# Patient Record
Sex: Female | Born: 1960
Health system: Southern US, Community
[De-identification: ages and names within clinical notes are randomized; demographics above are authoritative.]

---

## 2020-06-09 IMAGING — MR MR KNEE*L* W/O CM
4 of 6 series · 22 of 40 positions shown · non-contrast
Comparison: Left knee x-rays dated [DATE].

CLINICAL DATA: Left knee pain and swelling for the past 2 months.
No injury or prior surgery.

EXAM:
MRI OF THE LEFT KNEE WITHOUT CONTRAST
TECHNIQUE: Multiplanar, multisequence MR imaging of the knee was performed. No
intravenous contrast was administered.

[Series 3: T2 fat-sat · axial · 4.0mm · 0.50mm/px · z∈[-103,+26]mm · 5 of 27 slices shown (1 of 2)]
[im 1/27]
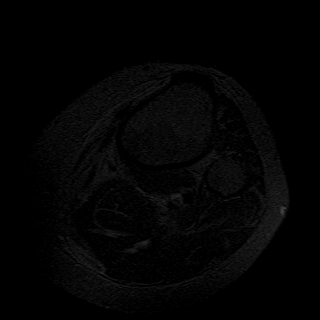
[im 7/27]
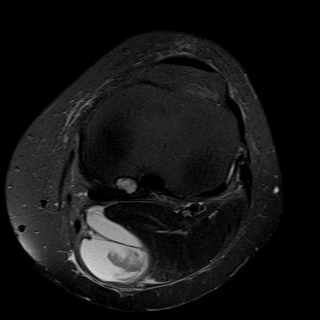
[im 14/27]
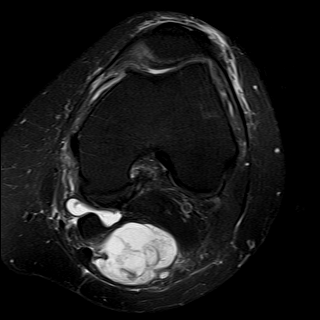
[im 20/27]
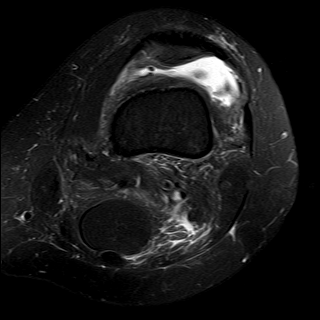
[im 27/27]
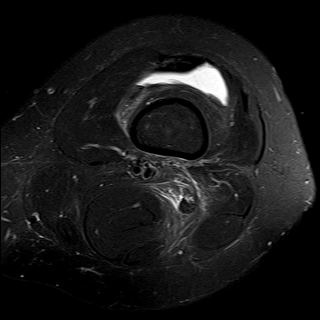

[Series 5: T2 fat-sat · coronal · 4.0mm · 0.31mm/px · 3 of 30 slices shown (2 of 2)]
[im 5/30]
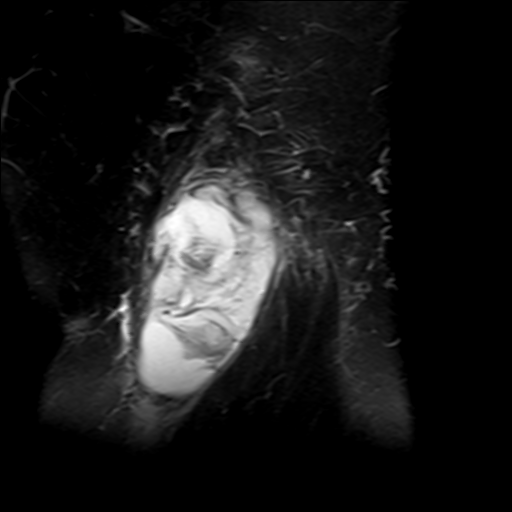
[im 15/30]
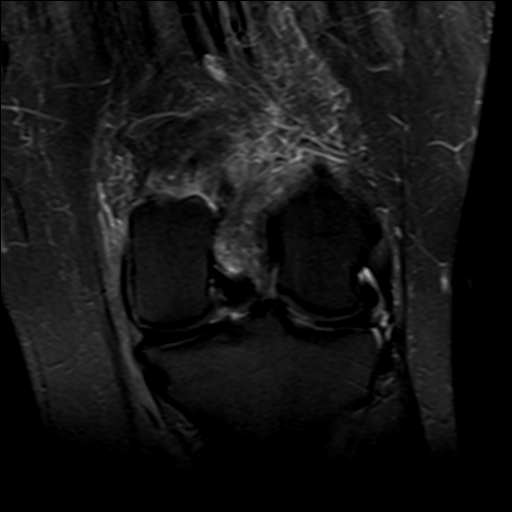
[im 25/30]
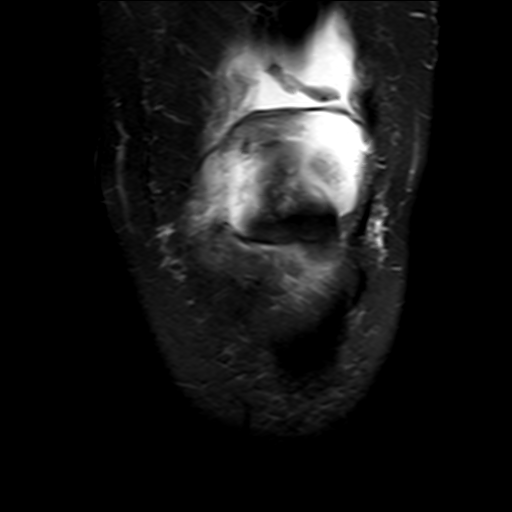

[Series 7: PD fat-sat · sagittal · 3.0mm · 0.31mm/px · 7 of 31 slices shown (1 of 2)]
[im 1/31]
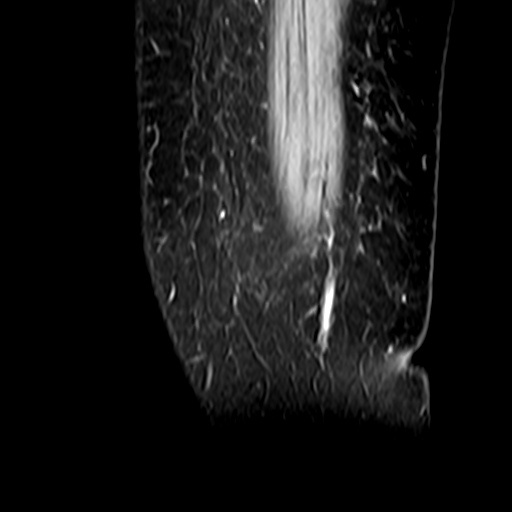
[im 6/31]
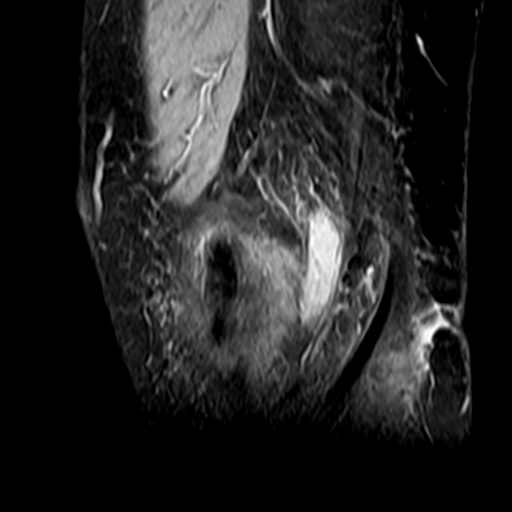
[im 11/31]
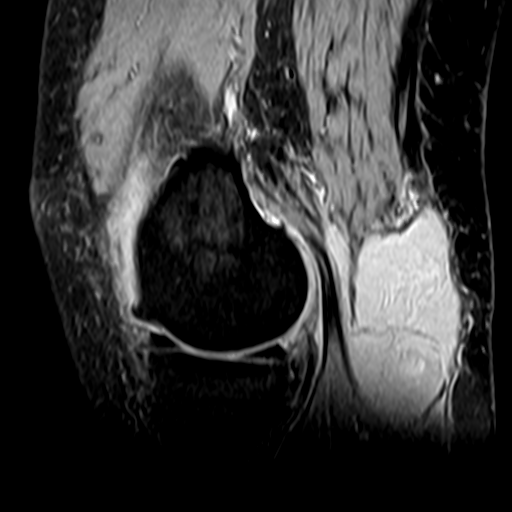
[im 16/31]
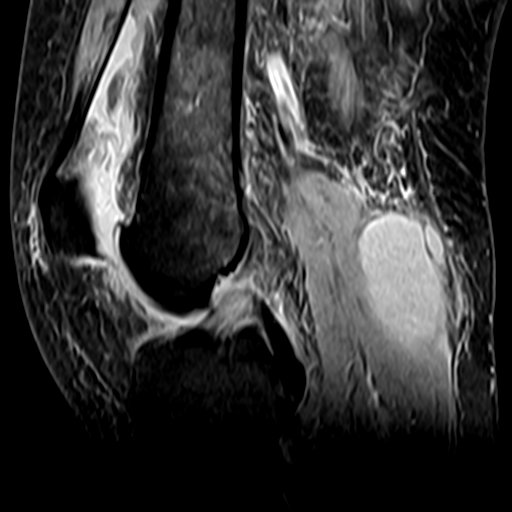
[im 21/31]
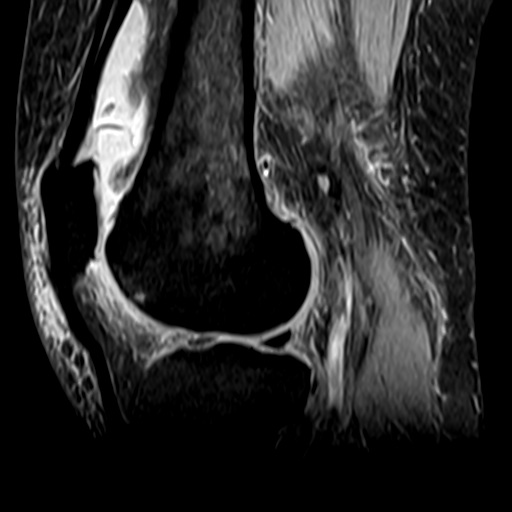
[im 26/31]
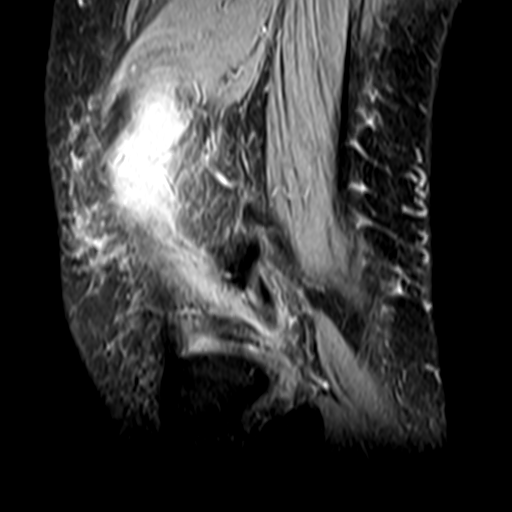
[im 31/31]
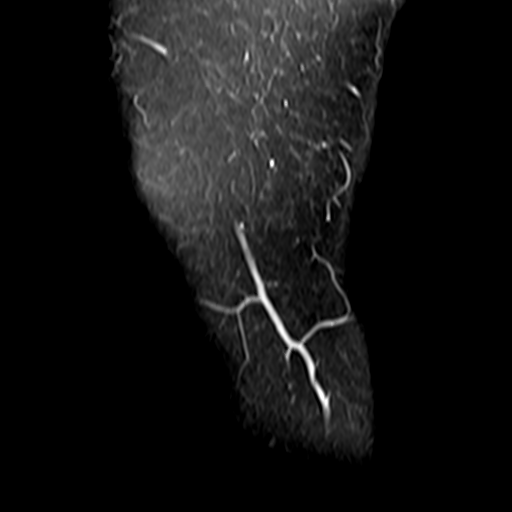

[Series 8: PD fat-sat · coronal · 4.0mm · 0.31mm/px · 7 of 30 slices shown (2 of 2)]
[im 1/30]
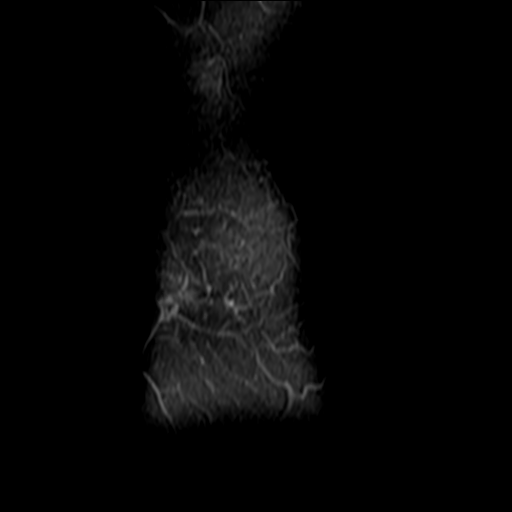
[im 5/30]
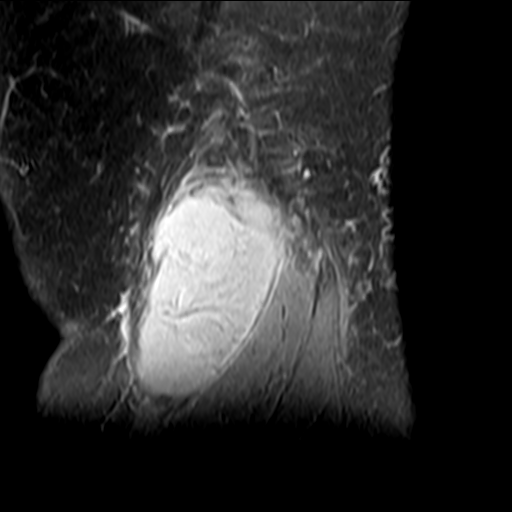
[im 10/30]
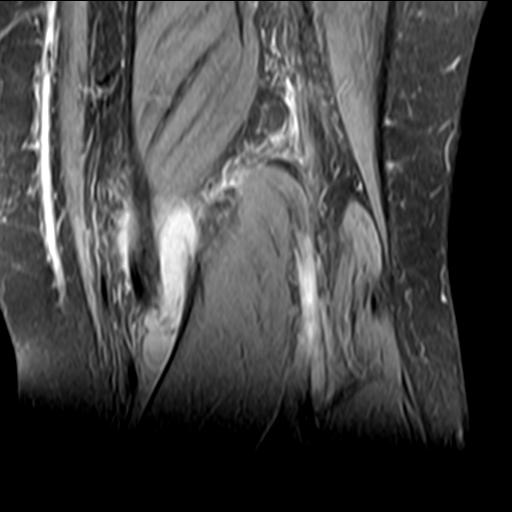
[im 15/30]
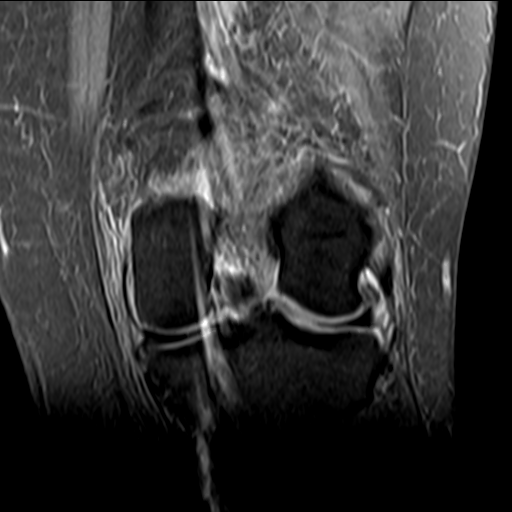
[im 20/30]
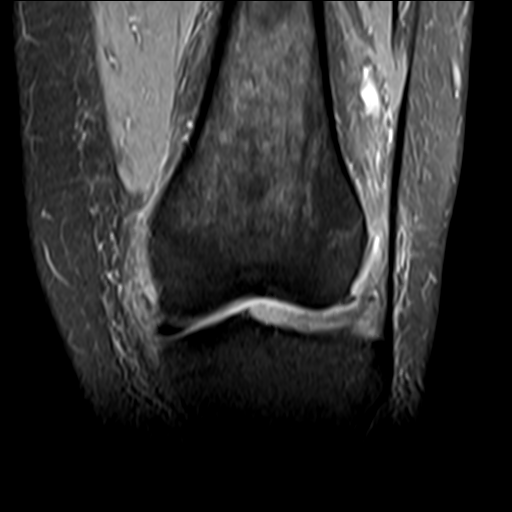
[im 25/30]
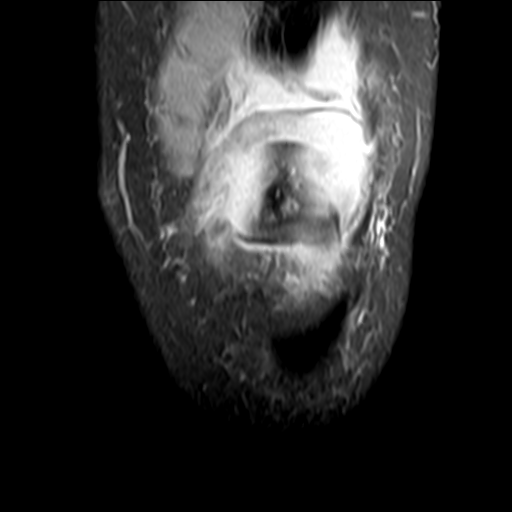
[im 30/30]
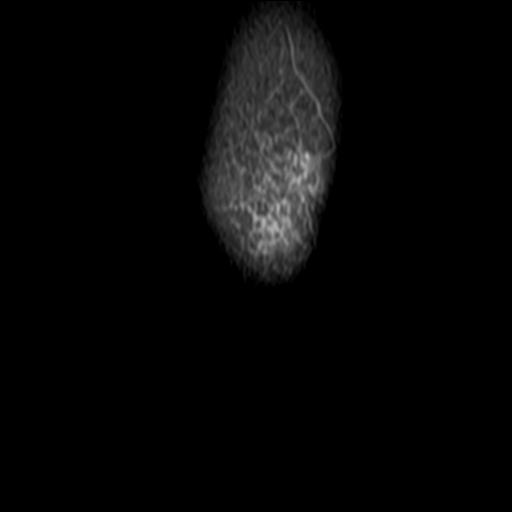

[22 of 40 positions shown; findings below may reference images not displayed]

FINDINGS: MENISCI

Medial meniscus:  Intact.

Lateral meniscus:  Intact.

LIGAMENTS

Cruciates:  Intact ACL and PCL.

Collaterals: Medial collateral ligament is intact. Lateral
collateral ligament complex is intact.

CARTILAGE

Patellofemoral: Full-thickness cartilage loss over the patellar apex
with underlying subchondral marrow edema. Partial-thickness
cartilage loss over the remaining patella and trochlea.

Medial:  Cartilage thinning centrally without focal defect.

Lateral:  Cartilage thinning centrally without focal defect.

Joint: Moderate joint effusion. Edema within the superolateral
aspect of Hoffa's fat.

Popliteal Fossa: Large complex Baker cyst. Intact popliteus tendon.

Extensor Mechanism: Intact quadriceps tendon and patellar tendon.
Intact medial and lateral patellar retinaculum. Intact MPFL.

Bones: No acute fracture or dislocation. No suspicious bone lesion.
Degenerative cyst or ganglion in the mesial aspect of the posterior
medial tibial plateau.

Other: None.
IMPRESSION: 1. No meniscal or ligamentous injury.
2. Tricompartmental osteoarthritis, moderate in the patellofemoral
compartment.
3. Edema within the superolateral aspect of Hoffa's fat pad, which
can be seen in the setting of patellar tendon-lateral femoral
condyle friction syndrome.
4. Moderate joint effusion. Large complex Baker cyst.

## 2020-06-15 IMAGING — MG DIGITAL SCREENING BILAT W/ TOMO W/ CAD
8 series · 9 of 24 positions shown · non-contrast
Comparison: None.

CLINICAL DATA: Screening.

EXAM:
DIGITAL SCREENING BILATERAL MAMMOGRAM WITH TOMO AND CAD

[R CC synth-2D]
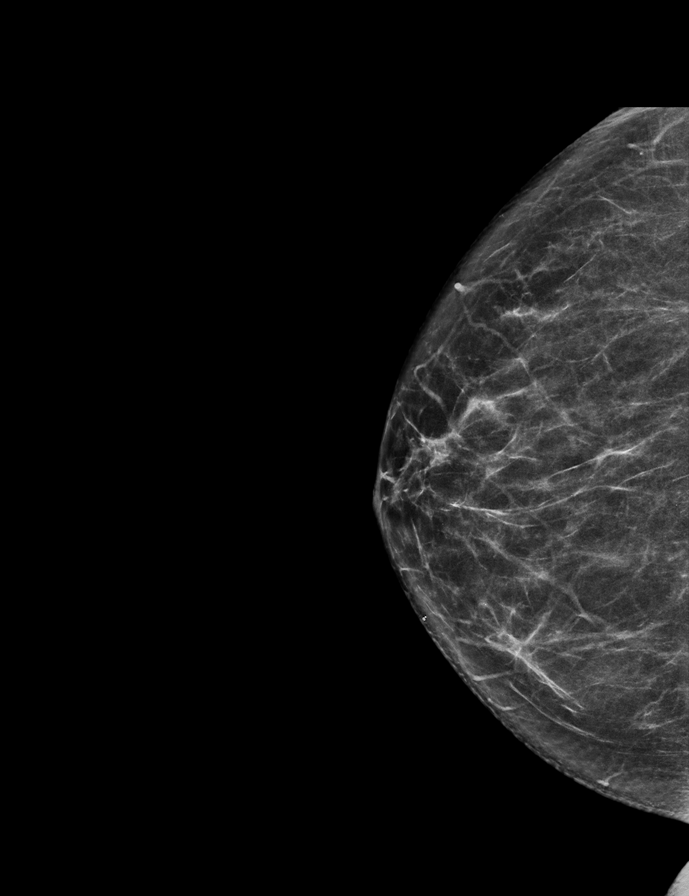

[L CC synth-2D]
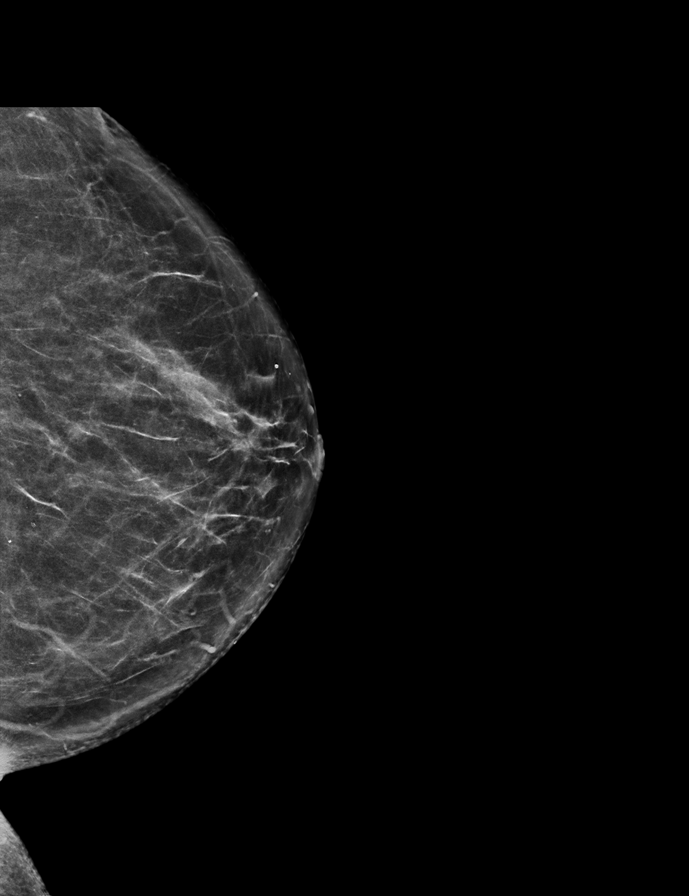

[R MLO synth-2D]
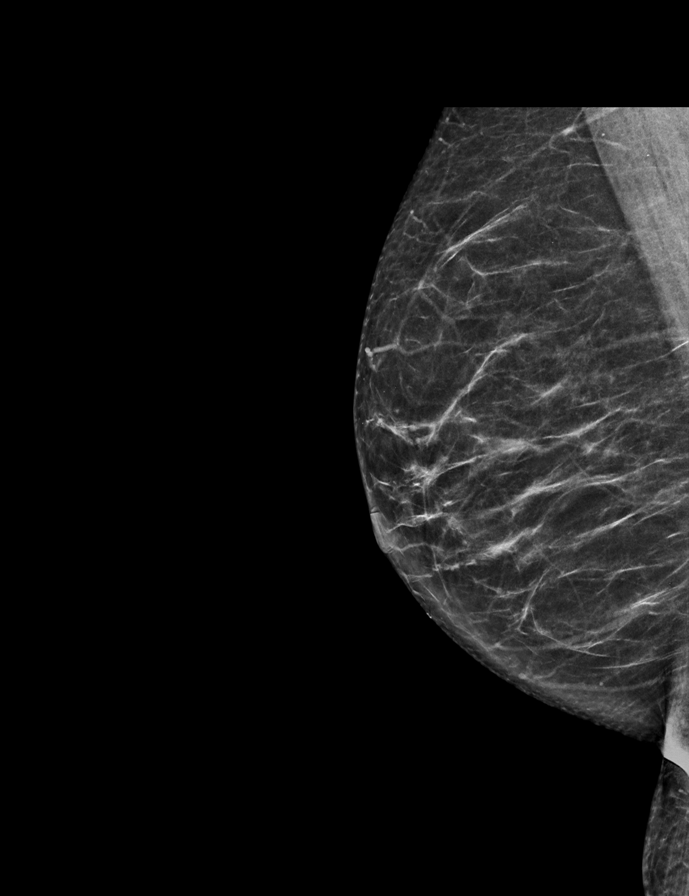

[L MLO synth-2D]
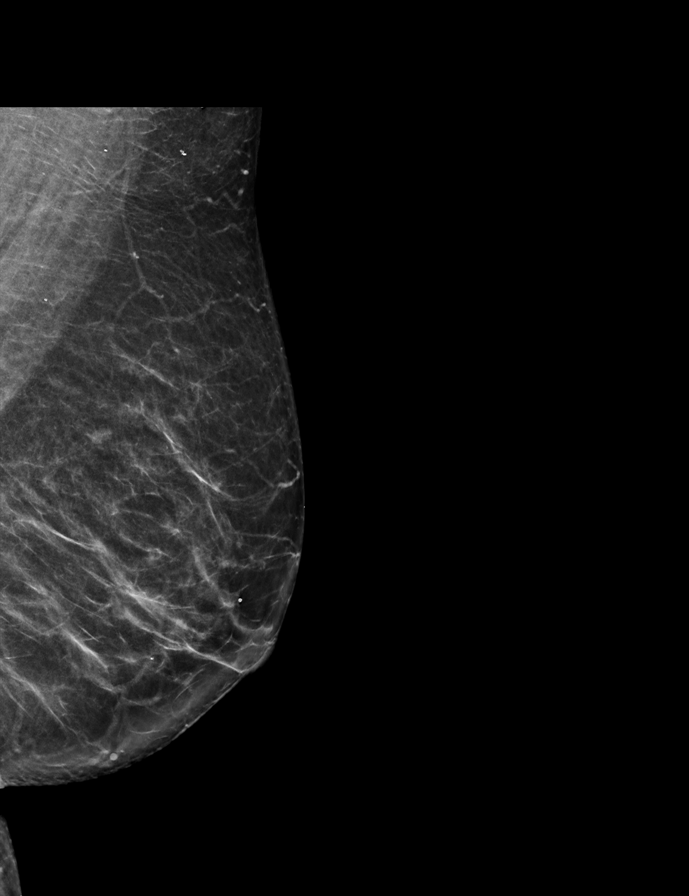

[L CC tomo · 2 of 68 frames shown]
[frame 22/68]
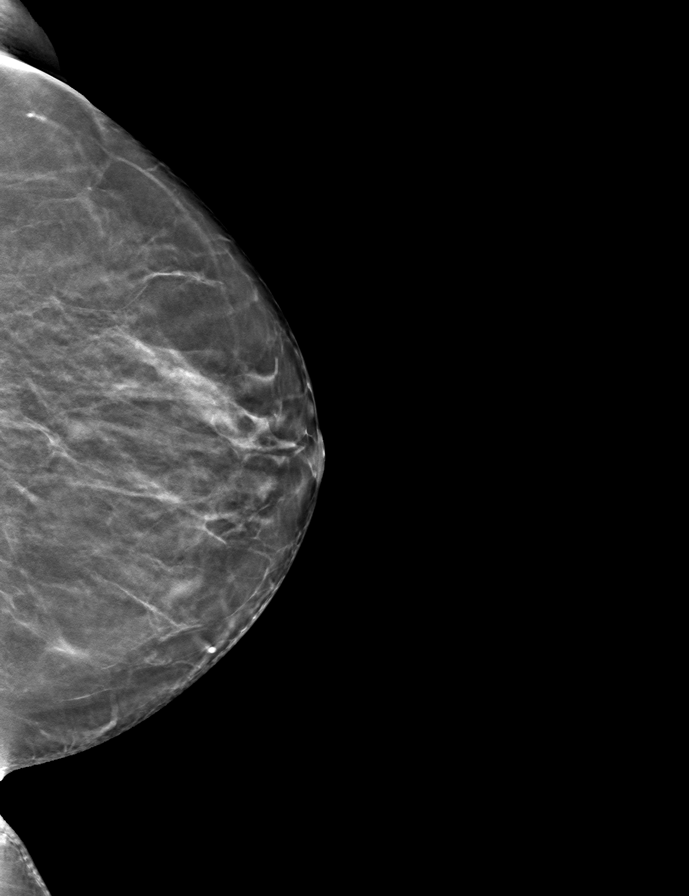
[frame 35/68]
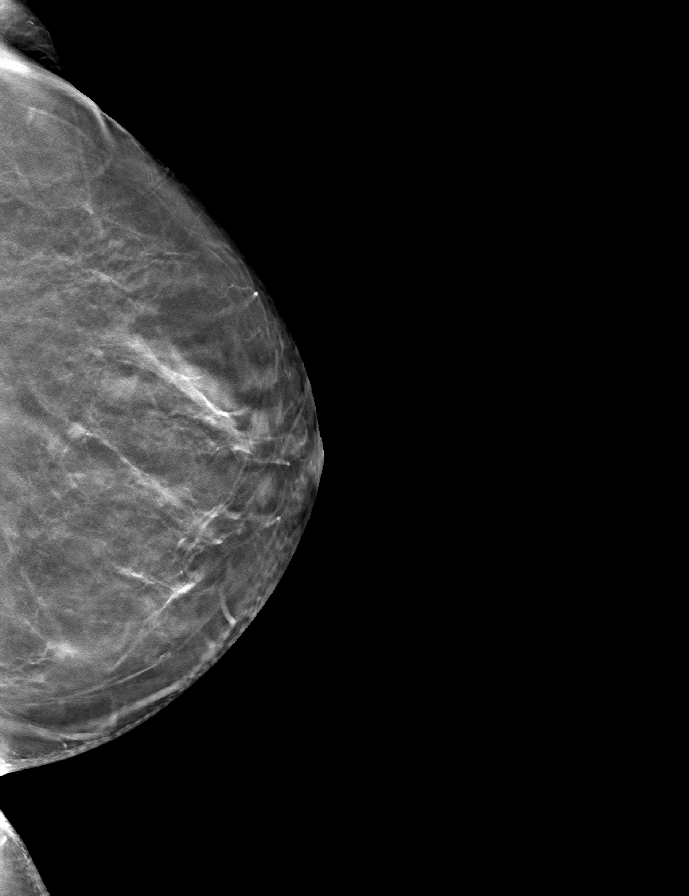

[R CC tomo · tomo slice 33/64.0]
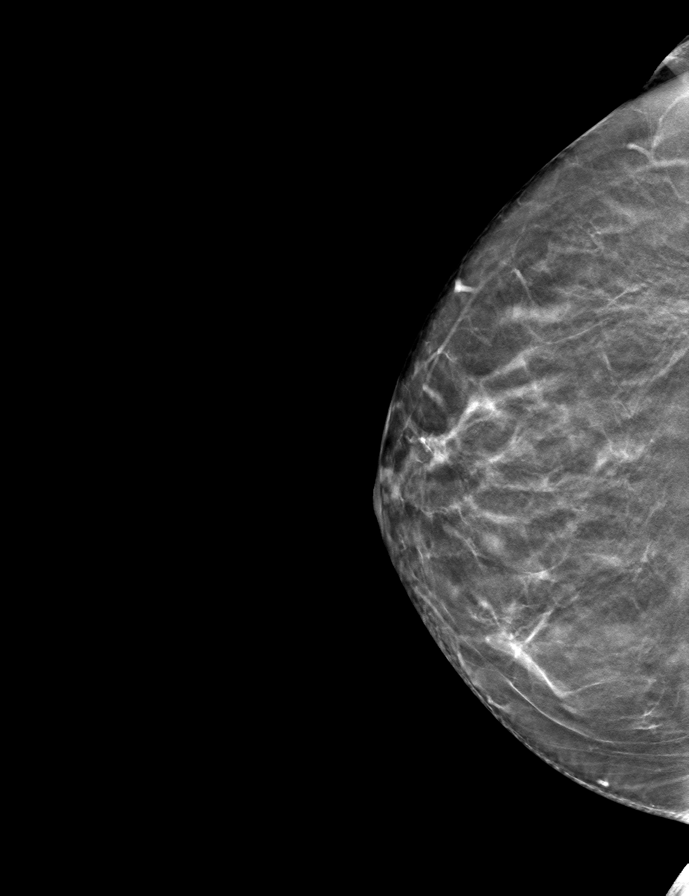

[R MLO tomo · tomo slice 33/65.0]
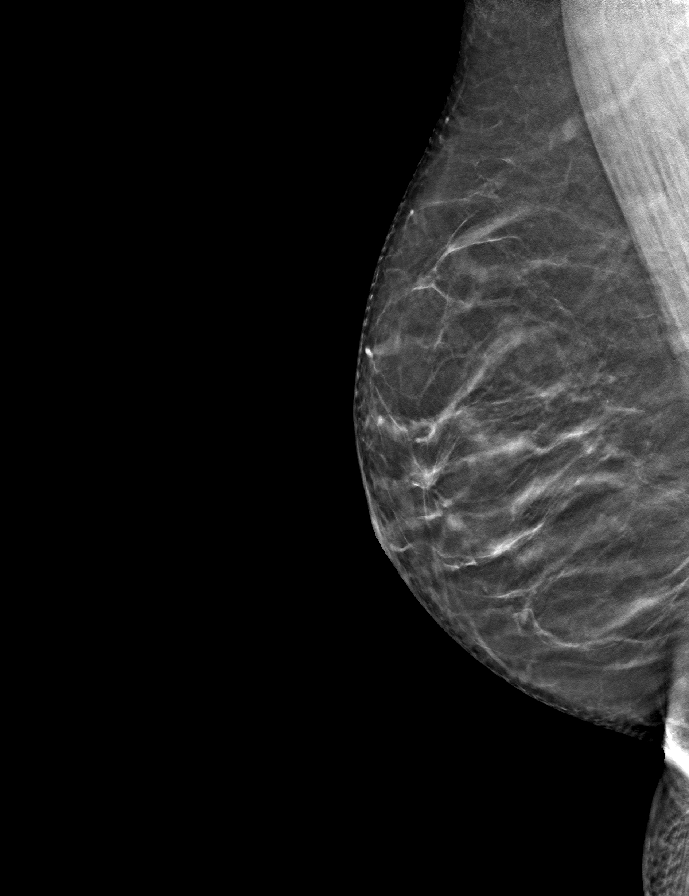

[L MLO tomo · tomo slice 33/66.0]
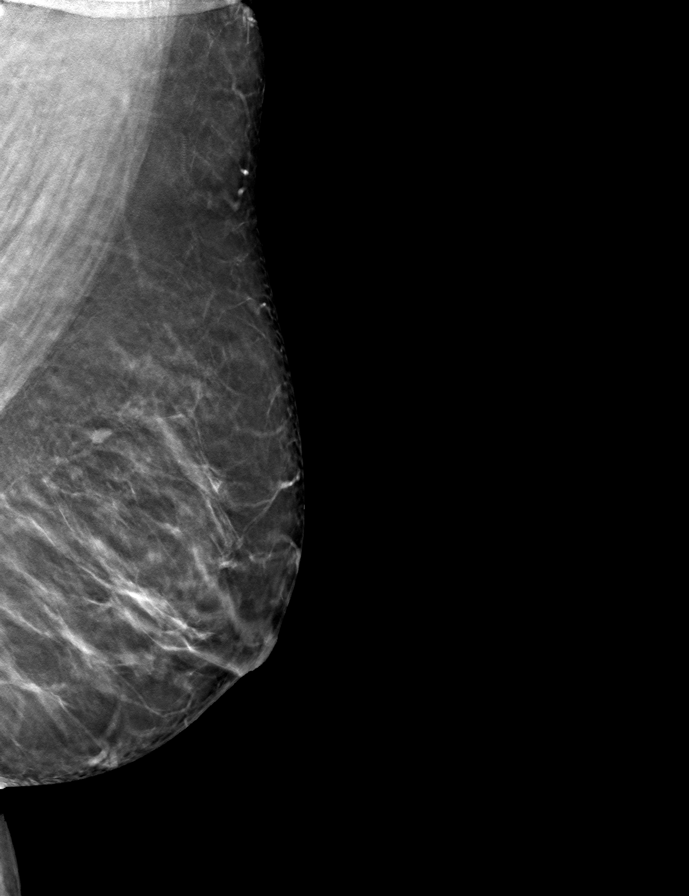

[9 of 24 positions shown; findings below may reference images not displayed]

ACR Breast Density Category b: There are scattered areas of
fibroglandular density.
FINDINGS: In the left breast, a possible mass warrants further evaluation. In
the right breast, no findings suspicious for malignancy.

Images were processed with CAD.
IMPRESSION: Further evaluation is suggested for a possible mass in the left
breast.

RECOMMENDATION:
Diagnostic mammogram and possibly ultrasound of the left breast.
(Code:[1W])

The patient will be contacted regarding the findings, and additional
imaging will be scheduled.

BI-RADS CATEGORY  0: Incomplete. Need additional imaging evaluation
and/or prior mammograms for comparison.

## 2020-07-10 IMAGING — MG MM DIGITAL DIAGNOSTIC UNILAT*L* W/ TOMO W/ CAD
4 series · 5 of 12 positions shown · non-contrast
Comparison: None.
COMPARISON: None.
COMPARISON: None.

Addendum:
CLINICAL DATA: Patient recalled from screening for left breast
mass.

EXAM:
DIGITAL DIAGNOSTIC LEFT MAMMOGRAM WITH CAD AND TOMO
ULTRASOUND LEFT BREAST

[L CC synth-2D]
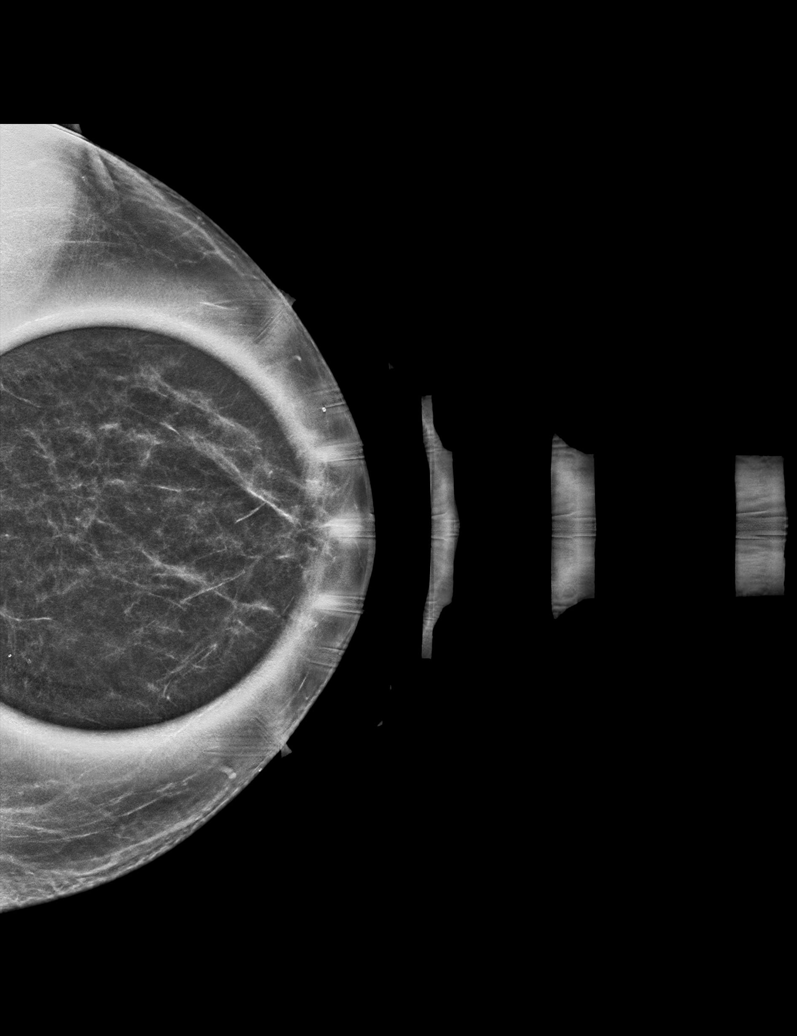

[L MLO synth-2D]
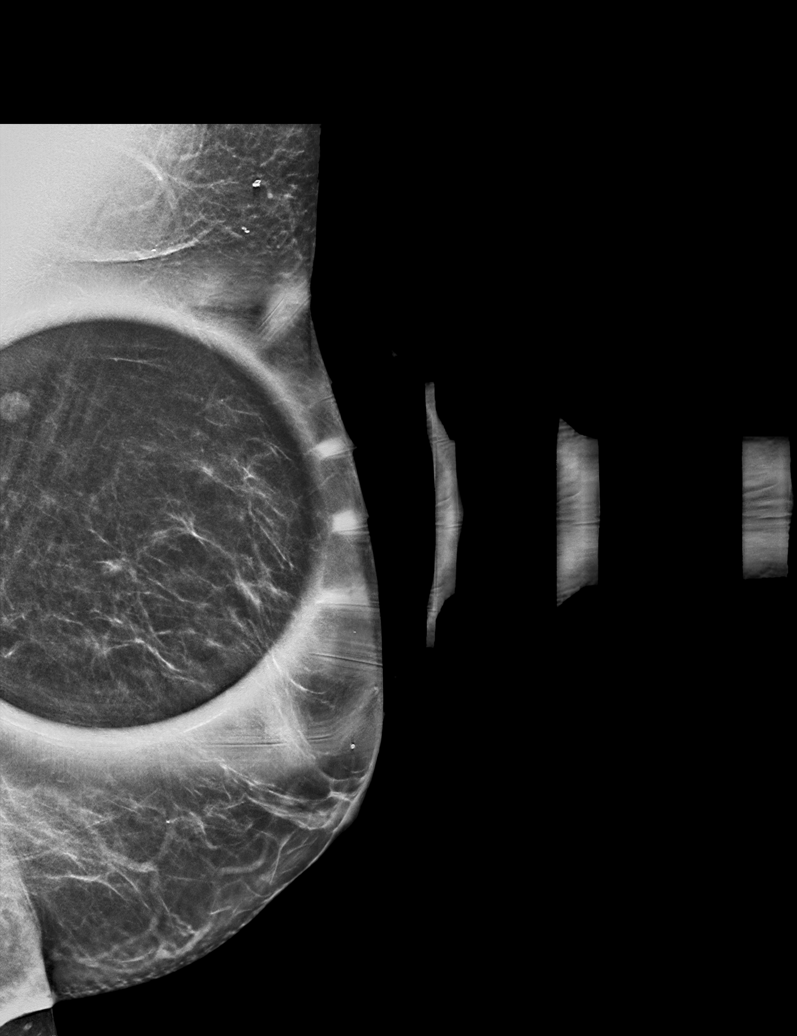

[L CC tomo · 2 of 56 frames shown]
[frame 19/56]
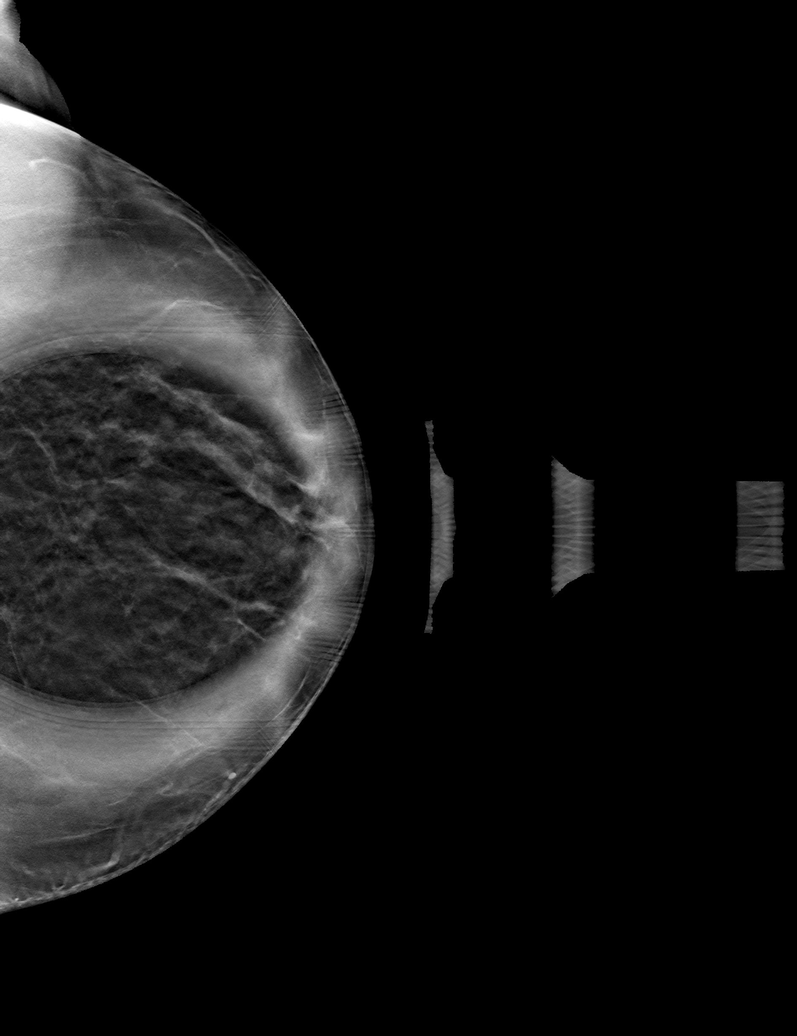
[frame 29/56]
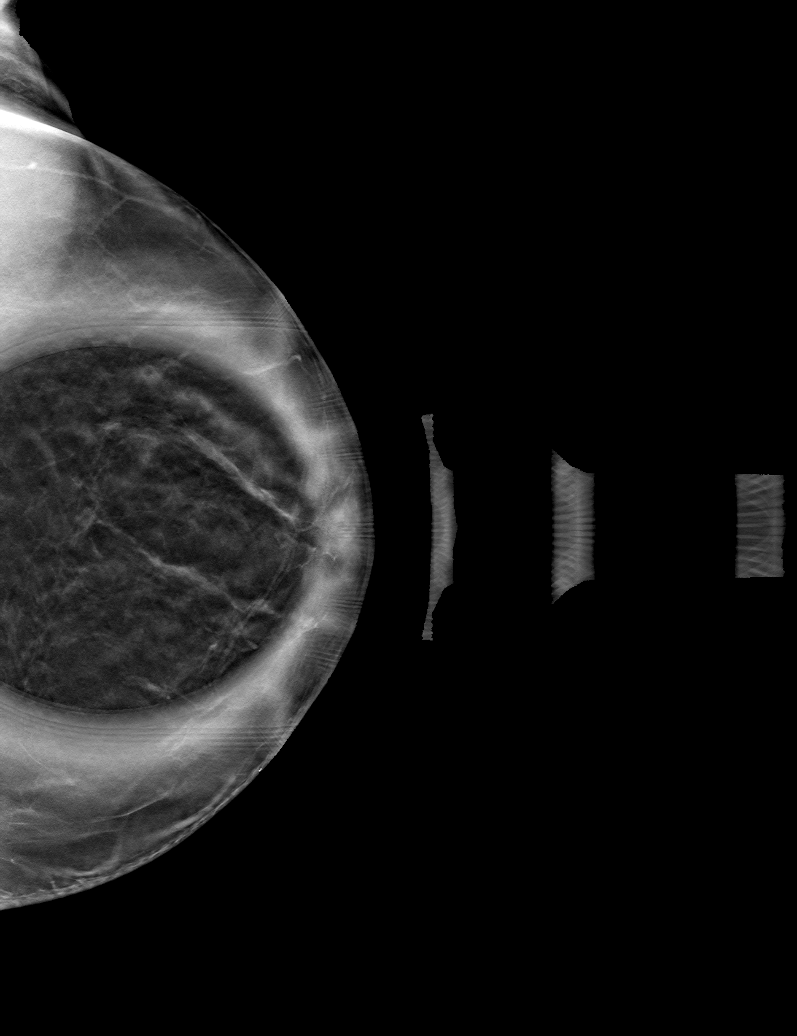

[L MLO tomo · tomo slice 31/61.0]
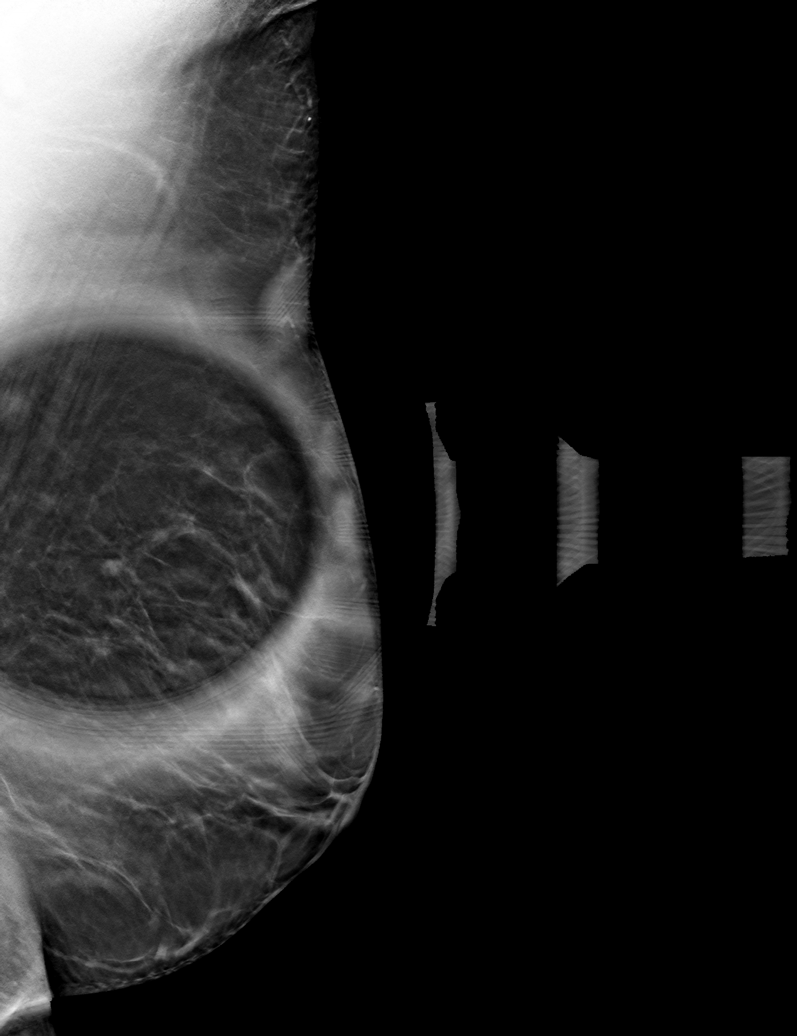

[5 of 12 positions shown; findings below may reference images not displayed]

ACR Breast Density Category b: There are scattered areas of
fibroglandular density.
FINDINGS: Within the superior left breast middle to posterior depth there is a
persistent lobular oval mass, further evaluated with additional
imaging.

Mammographic images were processed with CAD.

Targeted ultrasound is performed, showing no definite corresponding
solid or cystic mass within the left breast 12 o'clock position
posterior depth to correspond with mammographically identified mass.
IMPRESSION: Indeterminate mass within the superior left breast on mammogram.

RECOMMENDATION:
Patient reports outside prior mammograms. An attempt will be made to
obtain these outside prior exams.

If the outside prior exams are not able to be obtained, or the mass
is new compared to the prior exams, stereotactic guided core needle
biopsy is recommended. This will be scheduled for approximately 7-10
days from today so that the patient has a biopsy appointment should
the priors not be available for comparison.

I have discussed the findings and recommendations with the patient.
If applicable, a reminder letter will be sent to the patient
regarding the next appointment.

BI-RADS CATEGORY  0: Incomplete. Need additional imaging evaluation
and/or prior mammograms for comparison.

ADDENDUM:
Addendum for addition of findings on ultrasound.

The left axilla was scanned.  No left axillary adenopathy.

ADDENDUM:
Addendum for correction of BI-RADS category as outside priors were
obtained and compared with the current exam. Outside prior exams
were nondiagnostic therefore biopsy was still recommended.

BI-RADS category:

4: Suspicious.

*** End of Addendum ***
Addendum:
ACR Breast Density Category b: There are scattered areas of
fibroglandular density.
FINDINGS: Within the superior left breast middle to posterior depth there is a
persistent lobular oval mass, further evaluated with additional
imaging.

Mammographic images were processed with CAD.

Targeted ultrasound is performed, showing no definite corresponding
solid or cystic mass within the left breast 12 o'clock position
posterior depth to correspond with mammographically identified mass.
IMPRESSION: Indeterminate mass within the superior left breast on mammogram.

RECOMMENDATION:
Patient reports outside prior mammograms. An attempt will be made to
obtain these outside prior exams.

If the outside prior exams are not able to be obtained, or the mass
is new compared to the prior exams, stereotactic guided core needle
biopsy is recommended. This will be scheduled for approximately 7-10
days from today so that the patient has a biopsy appointment should
the priors not be available for comparison.

I have discussed the findings and recommendations with the patient.
If applicable, a reminder letter will be sent to the patient
regarding the next appointment.

BI-RADS CATEGORY  0: Incomplete. Need additional imaging evaluation
and/or prior mammograms for comparison.

ADDENDUM:
Addendum for addition of findings on ultrasound.

The left axilla was scanned.  No left axillary adenopathy.

*** End of Addendum ***
ACR Breast Density Category b: There are scattered areas of
fibroglandular density.
FINDINGS: Within the superior left breast middle to posterior depth there is a
persistent lobular oval mass, further evaluated with additional
imaging.

Mammographic images were processed with CAD.

Targeted ultrasound is performed, showing no definite corresponding
solid or cystic mass within the left breast 12 o'clock position
posterior depth to correspond with mammographically identified mass.
IMPRESSION: Indeterminate mass within the superior left breast on mammogram.

RECOMMENDATION:
Patient reports outside prior mammograms. An attempt will be made to
obtain these outside prior exams.

If the outside prior exams are not able to be obtained, or the mass
is new compared to the prior exams, stereotactic guided core needle
biopsy is recommended. This will be scheduled for approximately 7-10
days from today so that the patient has a biopsy appointment should
the priors not be available for comparison.

I have discussed the findings and recommendations with the patient.
If applicable, a reminder letter will be sent to the patient
regarding the next appointment.

BI-RADS CATEGORY  0: Incomplete. Need additional imaging evaluation
and/or prior mammograms for comparison.

## 2020-07-10 IMAGING — US US BREAST*L* LIMITED INC AXILLA
1 series · 3 of 3 positions shown · non-contrast
Comparison: None.
COMPARISON: None.
COMPARISON: None.

Addendum:
CLINICAL DATA: Patient recalled from screening for left breast
mass.

EXAM:
DIGITAL DIAGNOSTIC LEFT MAMMOGRAM WITH CAD AND TOMO
ULTRASOUND LEFT BREAST

[Series 1: us breast*left* limited inc axilla · 0.06mm/px · 3 of 3 slices shown]
[im 1/3]
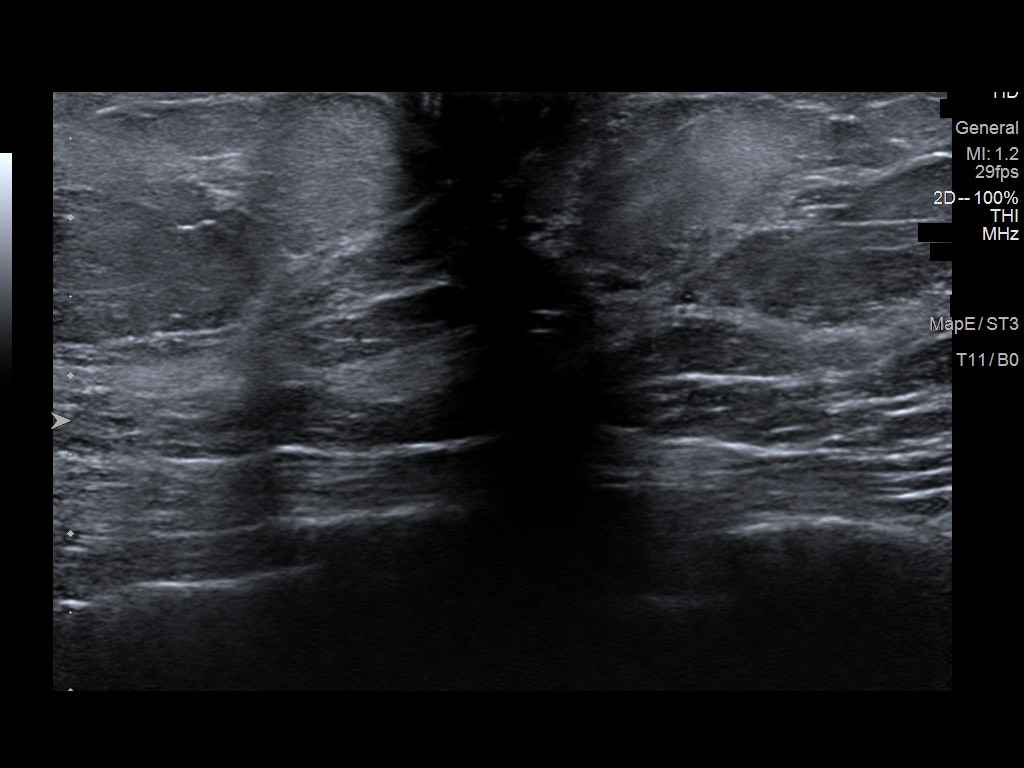
[im 2/3]
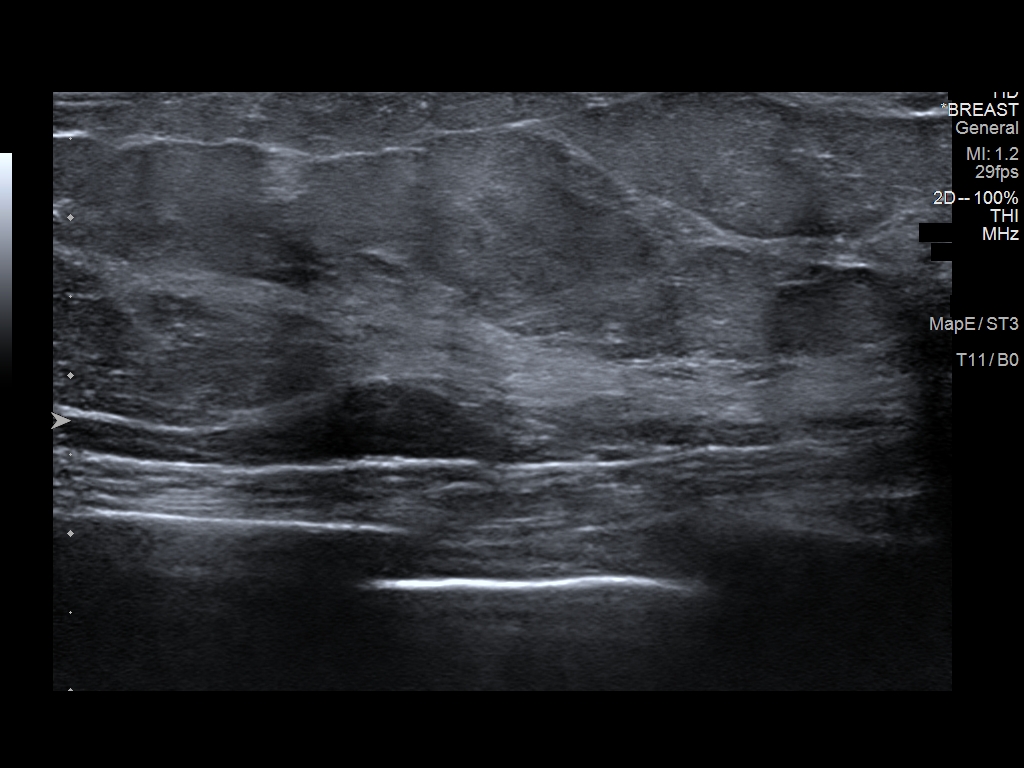
[im 3/3]
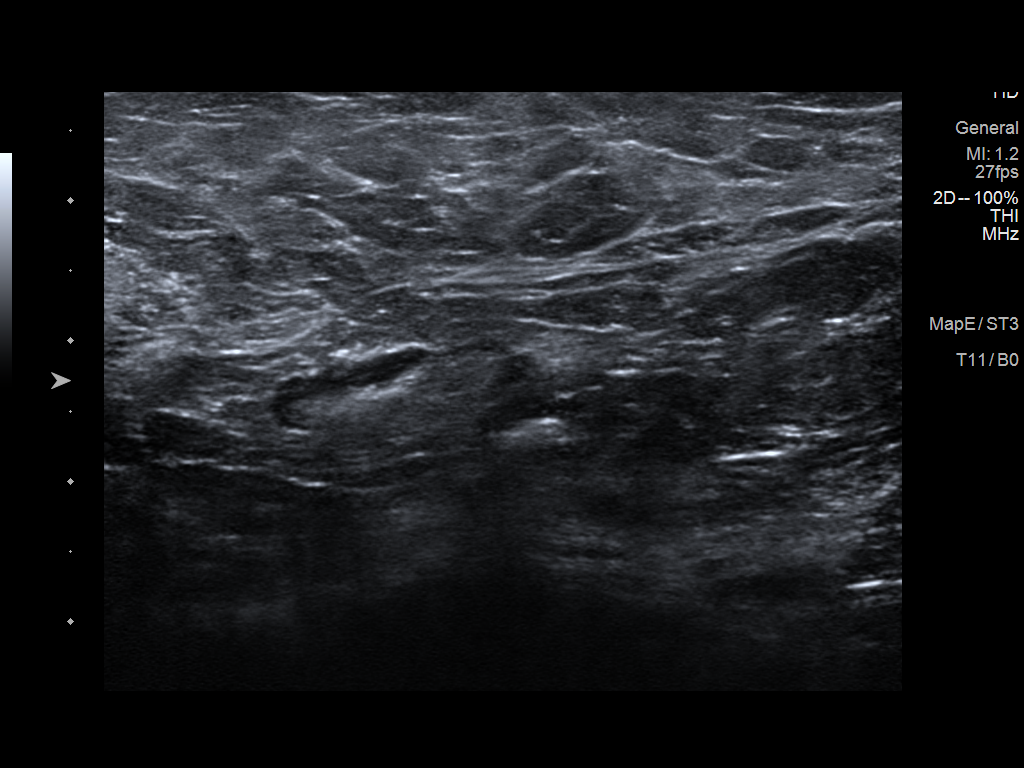

[3 of 3 positions shown; findings below may reference images not displayed]

ACR Breast Density Category b: There are scattered areas of
fibroglandular density.
FINDINGS: Within the superior left breast middle to posterior depth there is a
persistent lobular oval mass, further evaluated with additional
imaging.

Mammographic images were processed with CAD.

Targeted ultrasound is performed, showing no definite corresponding
solid or cystic mass within the left breast 12 o'clock position
posterior depth to correspond with mammographically identified mass.
IMPRESSION: Indeterminate mass within the superior left breast on mammogram.

RECOMMENDATION:
Patient reports outside prior mammograms. An attempt will be made to
obtain these outside prior exams.

If the outside prior exams are not able to be obtained, or the mass
is new compared to the prior exams, stereotactic guided core needle
biopsy is recommended. This will be scheduled for approximately 7-10
days from today so that the patient has a biopsy appointment should
the priors not be available for comparison.

I have discussed the findings and recommendations with the patient.
If applicable, a reminder letter will be sent to the patient
regarding the next appointment.

BI-RADS CATEGORY  0: Incomplete. Need additional imaging evaluation
and/or prior mammograms for comparison.

ADDENDUM:
Addendum for addition of findings on ultrasound.

The left axilla was scanned.  No left axillary adenopathy.

ADDENDUM:
Addendum for correction of BI-RADS category as outside priors were
obtained and compared with the current exam. Outside prior exams
were nondiagnostic therefore biopsy was still recommended.

BI-RADS category:

4: Suspicious.

*** End of Addendum ***
Addendum:
ACR Breast Density Category b: There are scattered areas of
fibroglandular density.
FINDINGS: Within the superior left breast middle to posterior depth there is a
persistent lobular oval mass, further evaluated with additional
imaging.

Mammographic images were processed with CAD.

Targeted ultrasound is performed, showing no definite corresponding
solid or cystic mass within the left breast 12 o'clock position
posterior depth to correspond with mammographically identified mass.
IMPRESSION: Indeterminate mass within the superior left breast on mammogram.

RECOMMENDATION:
Patient reports outside prior mammograms. An attempt will be made to
obtain these outside prior exams.

If the outside prior exams are not able to be obtained, or the mass
is new compared to the prior exams, stereotactic guided core needle
biopsy is recommended. This will be scheduled for approximately 7-10
days from today so that the patient has a biopsy appointment should
the priors not be available for comparison.

I have discussed the findings and recommendations with the patient.
If applicable, a reminder letter will be sent to the patient
regarding the next appointment.

BI-RADS CATEGORY  0: Incomplete. Need additional imaging evaluation
and/or prior mammograms for comparison.

ADDENDUM:
Addendum for addition of findings on ultrasound.

The left axilla was scanned.  No left axillary adenopathy.

*** End of Addendum ***
ACR Breast Density Category b: There are scattered areas of
fibroglandular density.
FINDINGS: Within the superior left breast middle to posterior depth there is a
persistent lobular oval mass, further evaluated with additional
imaging.

Mammographic images were processed with CAD.

Targeted ultrasound is performed, showing no definite corresponding
solid or cystic mass within the left breast 12 o'clock position
posterior depth to correspond with mammographically identified mass.
IMPRESSION: Indeterminate mass within the superior left breast on mammogram.

RECOMMENDATION:
Patient reports outside prior mammograms. An attempt will be made to
obtain these outside prior exams.

If the outside prior exams are not able to be obtained, or the mass
is new compared to the prior exams, stereotactic guided core needle
biopsy is recommended. This will be scheduled for approximately 7-10
days from today so that the patient has a biopsy appointment should
the priors not be available for comparison.

I have discussed the findings and recommendations with the patient.
If applicable, a reminder letter will be sent to the patient
regarding the next appointment.

BI-RADS CATEGORY  0: Incomplete. Need additional imaging evaluation
and/or prior mammograms for comparison.

## 2020-07-20 IMAGING — MG MM BREAST LOCALIZATION CLIP
4 series · 4 of 12 positions shown · non-contrast
Comparison: Previous exam(s).

CLINICAL DATA: Confirmation clip placement after stereotactic
tomosynthesis core needle biopsy of an indeterminate mass in the
UPPER LEFT breast at POSTERIOR depth.

EXAM:
2D AND TOMOSYNTHESIS DIAGNOSTIC LEFT MAMMOGRAM POST STEREOTACTIC
BIOPSY

[L ML synth-2D]
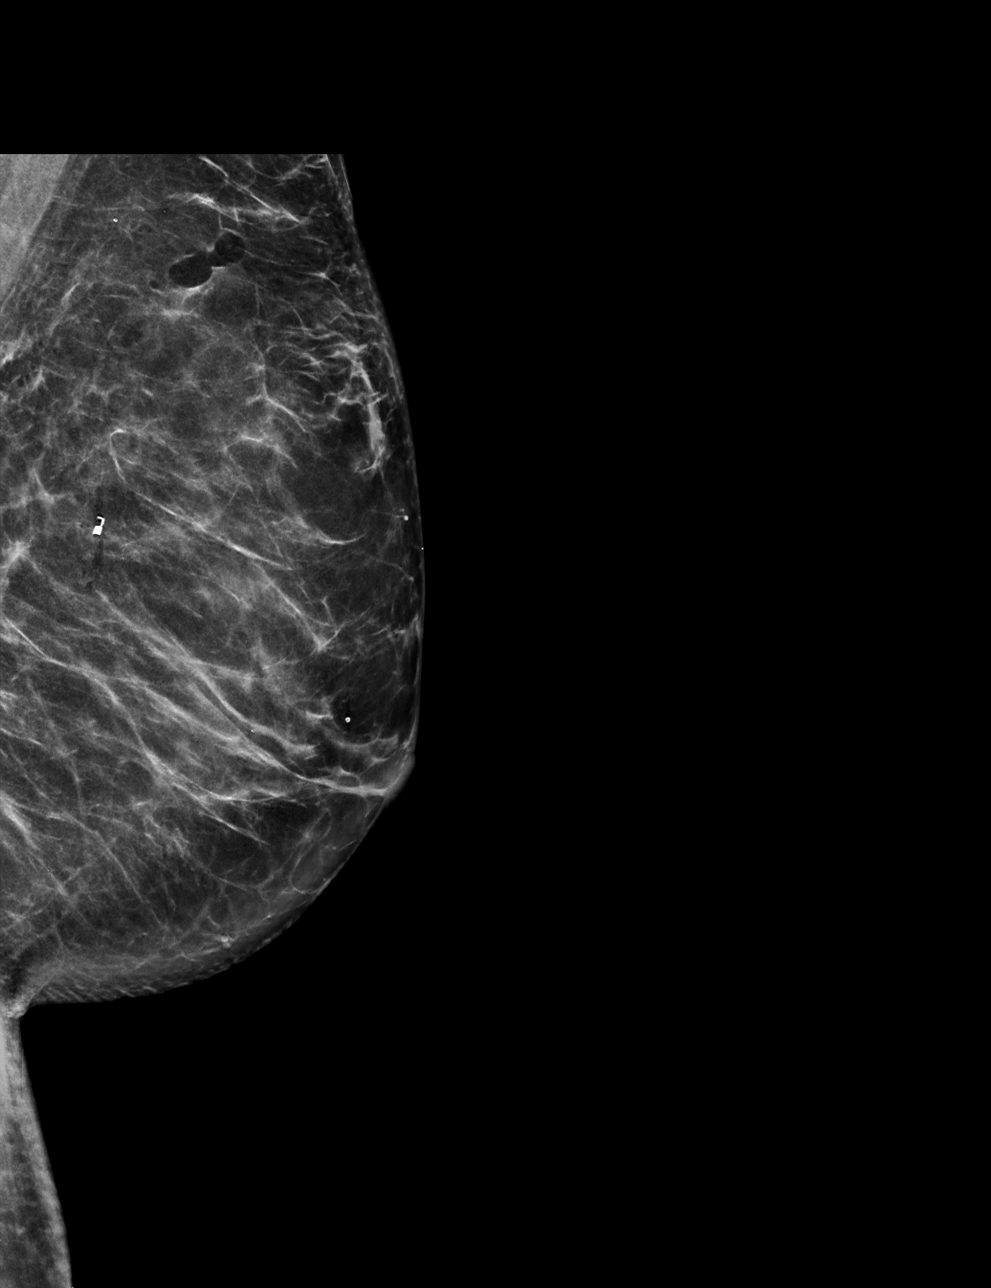

[L CC synth-2D]
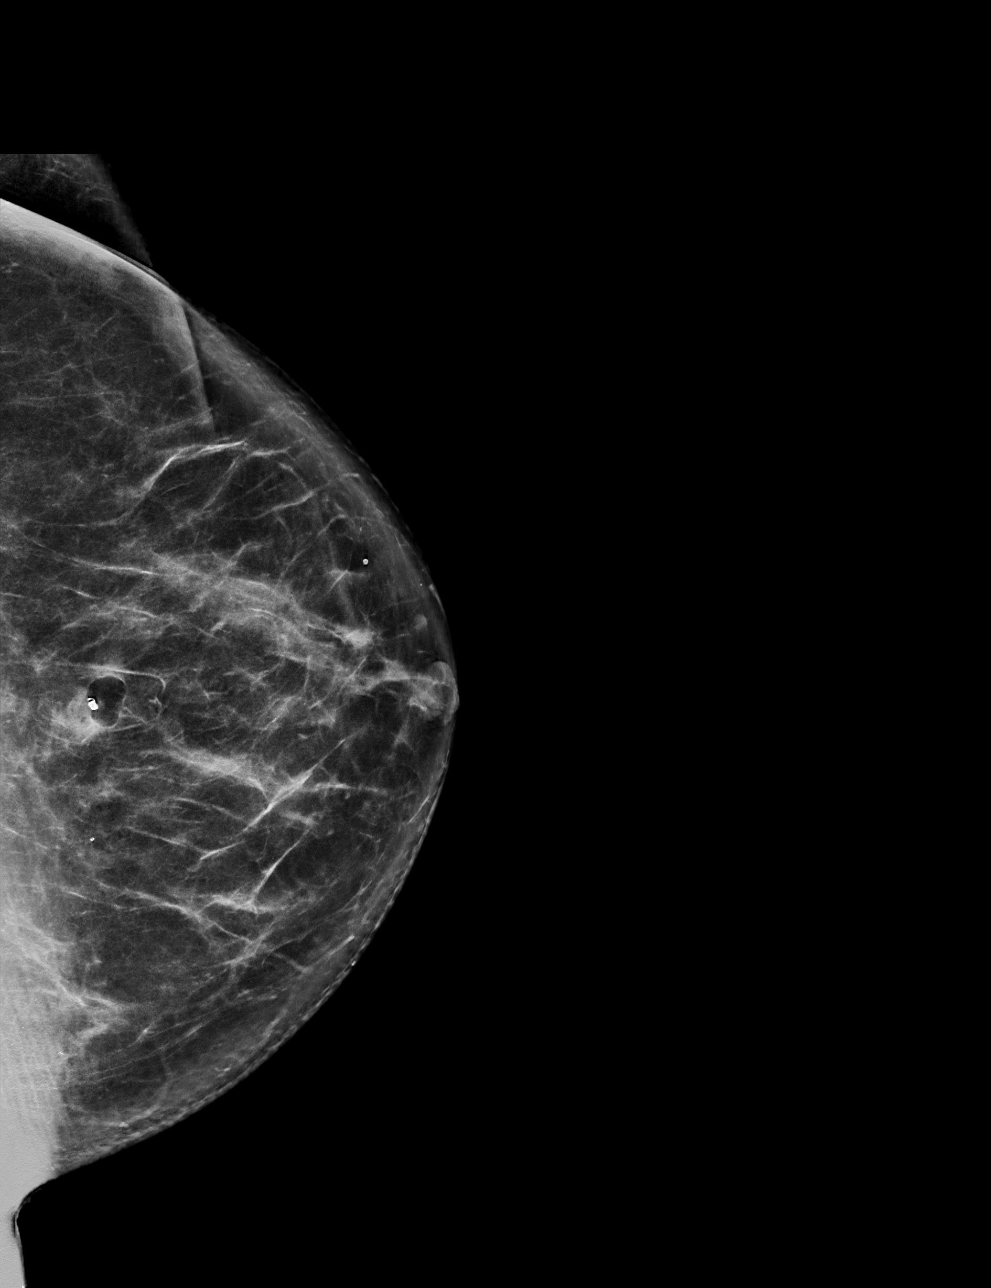

[L CC tomo · tomo slice 35/70.0]
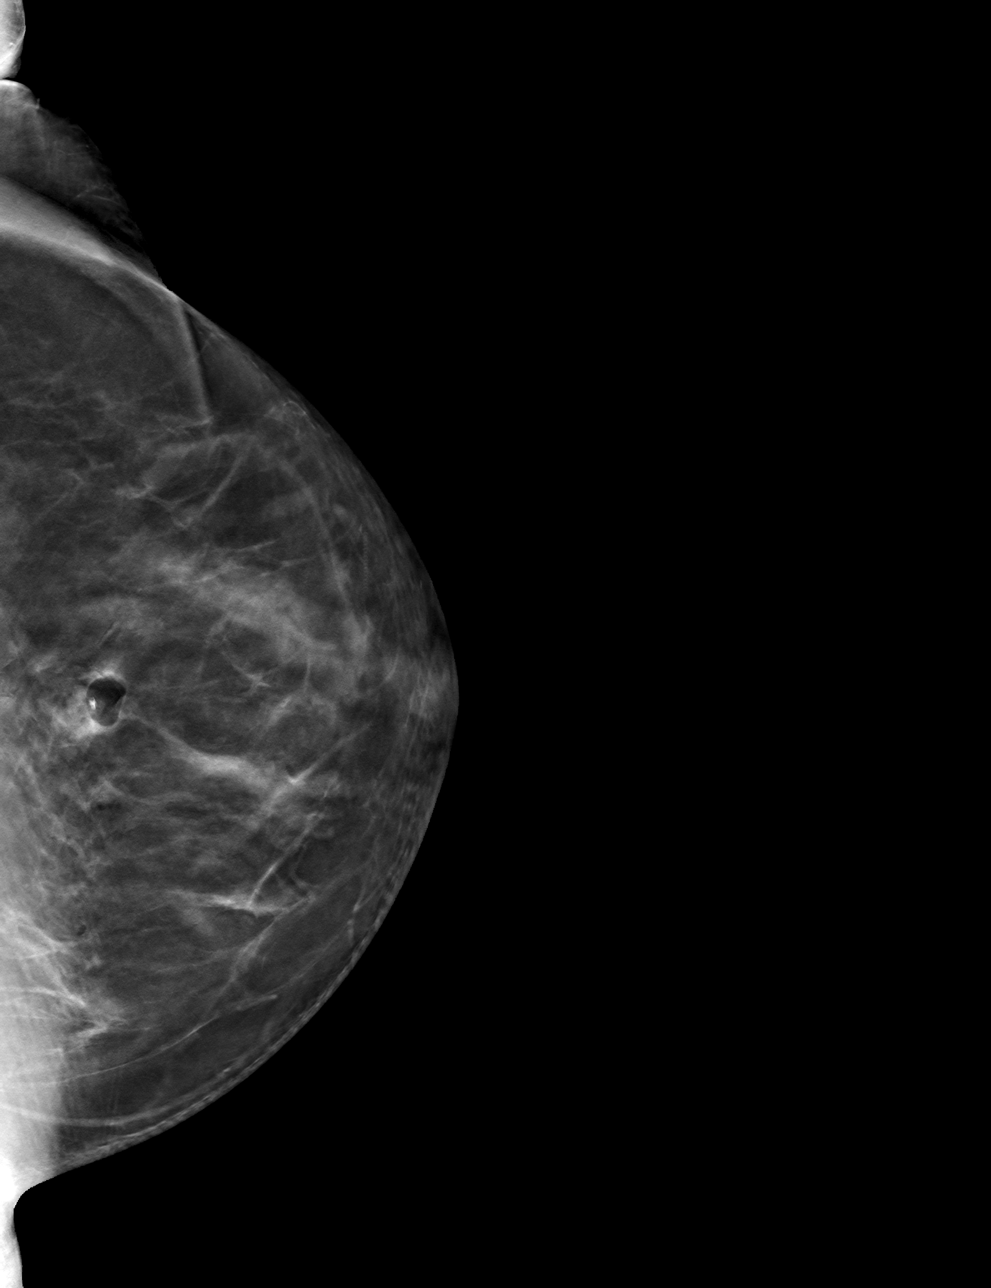

[L ML tomo · tomo slice 33/66.0]
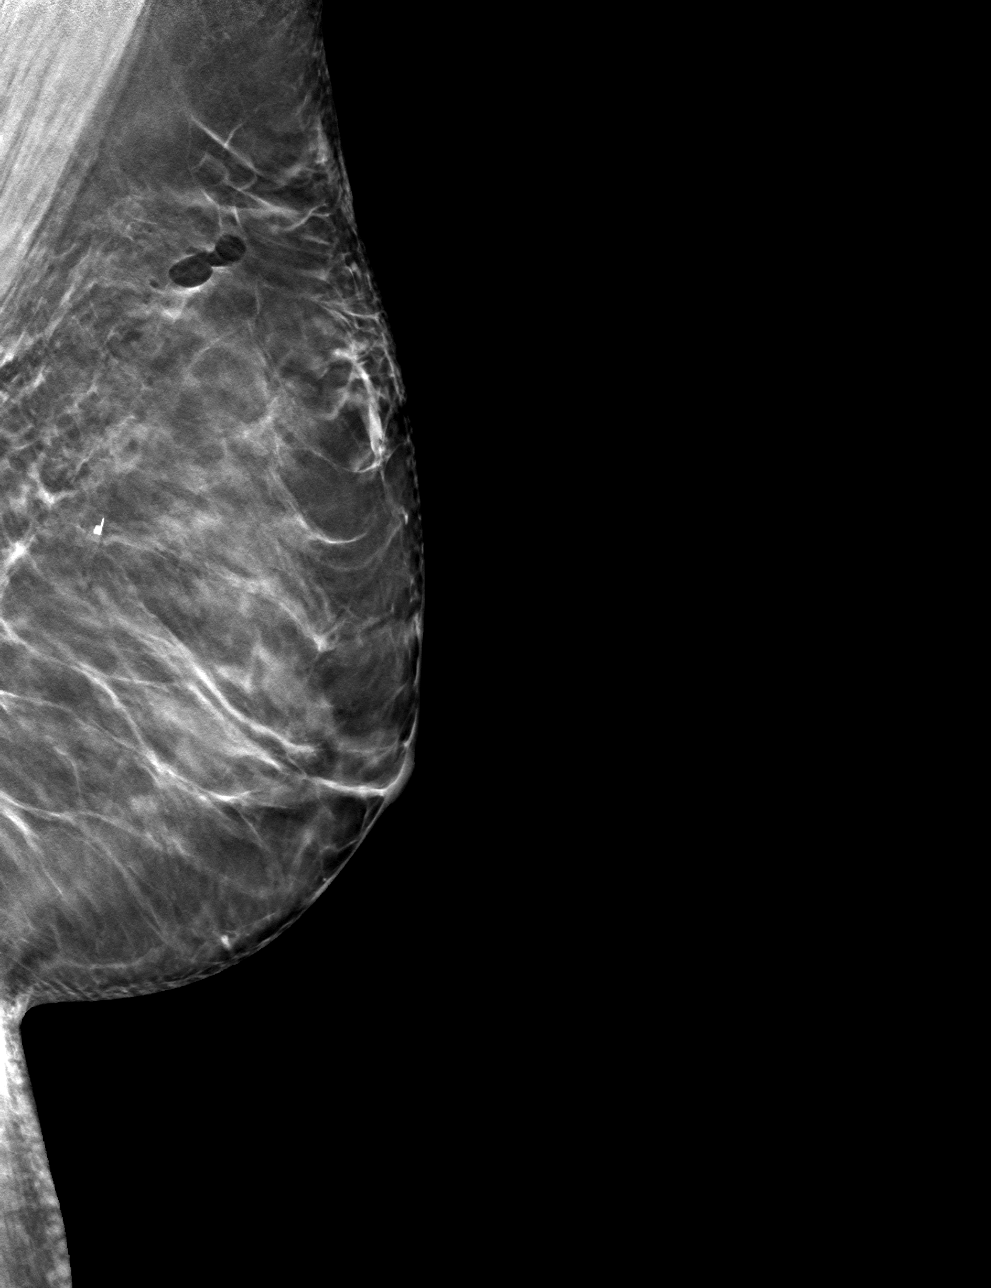

[4 of 12 positions shown; findings below may reference images not displayed]

FINDINGS: Tomosynthesis and synthesized full field CC and mediolateral images
were obtained following stereotactic tomosynthesis guided biopsy of
a mass in the UPPER LEFT breast. The coil shaped tissue marking clip
is appropriately positioned at the site of the biopsied mass in the
UPPER breast at POSTERIOR depth. In fact, on the tomosynthesis CC
images, the clip appears to be within mass.

Expected post biopsy changes are present without evidence of
hematoma.
IMPRESSION: Appropriate positioning of the coil shaped biopsy marking clip at
the site of the biopsied mass in the UPPER LEFT breast at POSTERIOR
depth.

Final Assessment: Post Procedure Mammograms for Marker Placement

## 2020-07-20 IMAGING — MG MM BREAST BX W LOC DEV 1ST LESION IMAGE BX SPEC STEREO GUIDE*L*
8 of 10 series · 8 of 18 positions shown · non-contrast
Comparison: Previous exams.
COMPARISON: Previous exams.

Addendum:
CLINICAL DATA: 59-year-old with a screening detected indeterminate
5 mm mass involving the UPPER LEFT breast at POSTERIOR depth, near
12 o'clock location, without sonographic correlate.

EXAM:
LEFT BREAST STEREOTACTIC CORE NEEDLE BIOPSY

[L (1 of 5)]
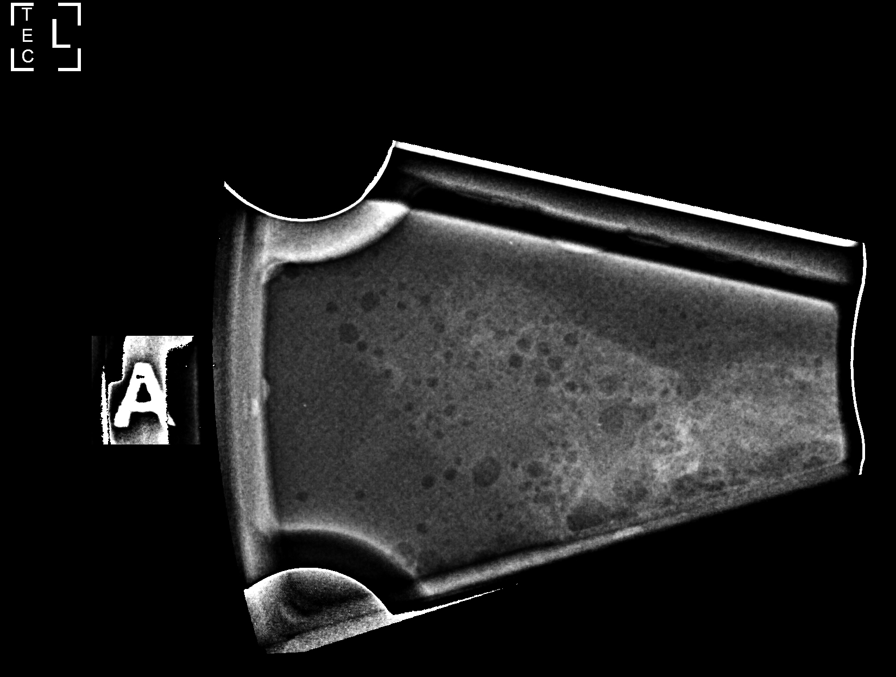

[L (2 of 5)]
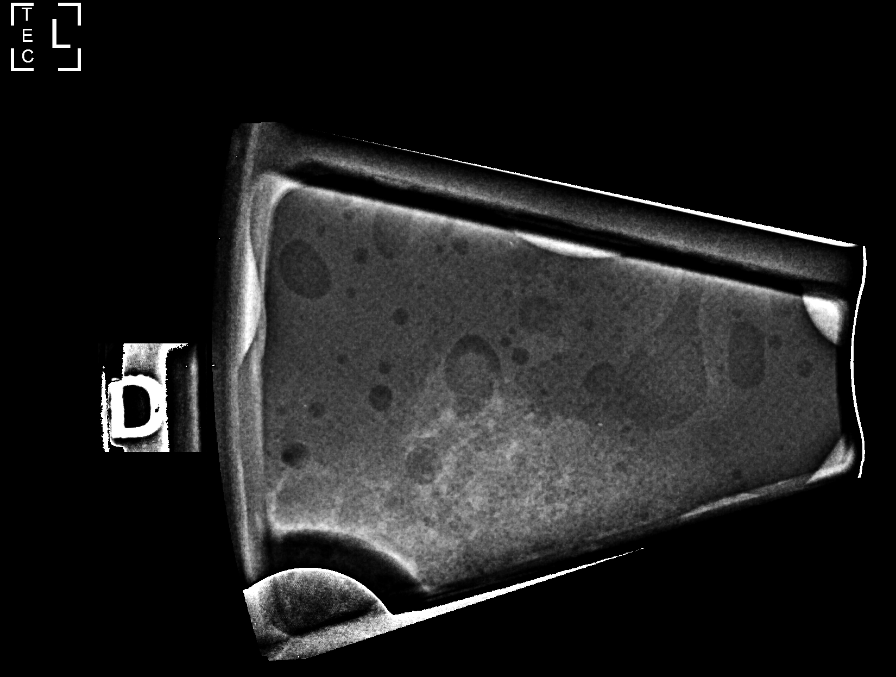

[L (3 of 5)]
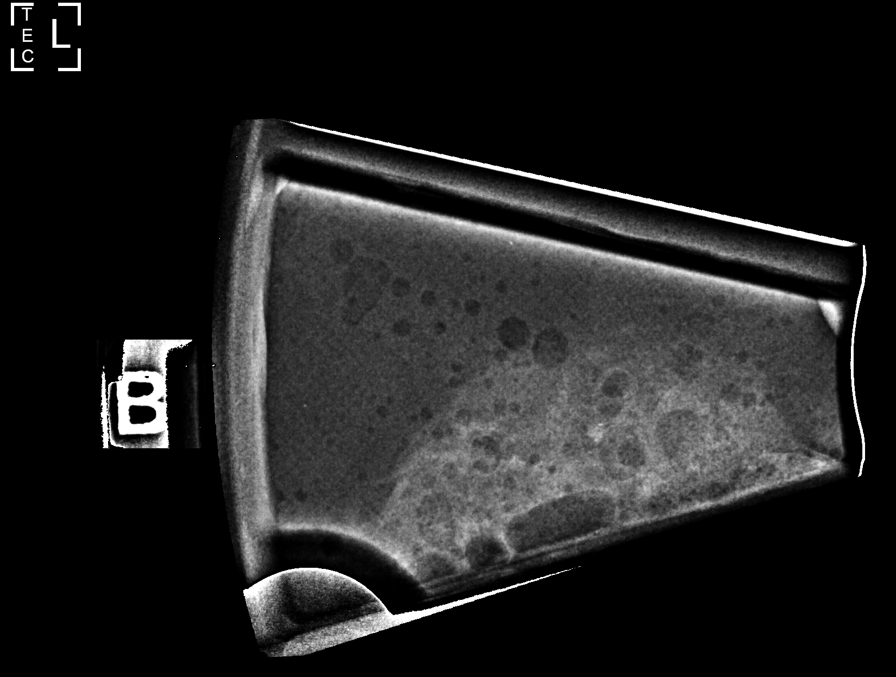

[L (4 of 5)]
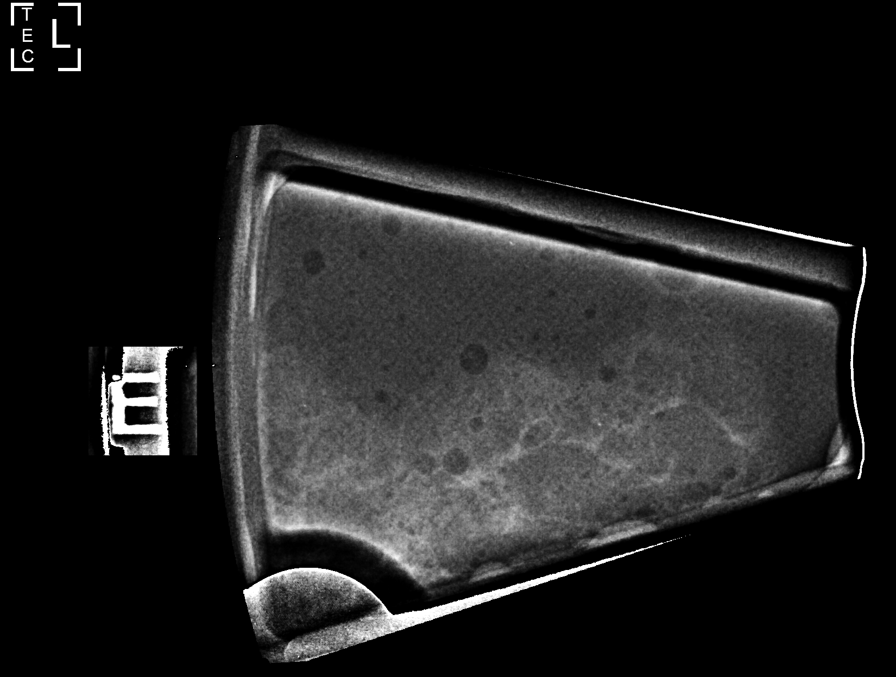

[L (5 of 5)]
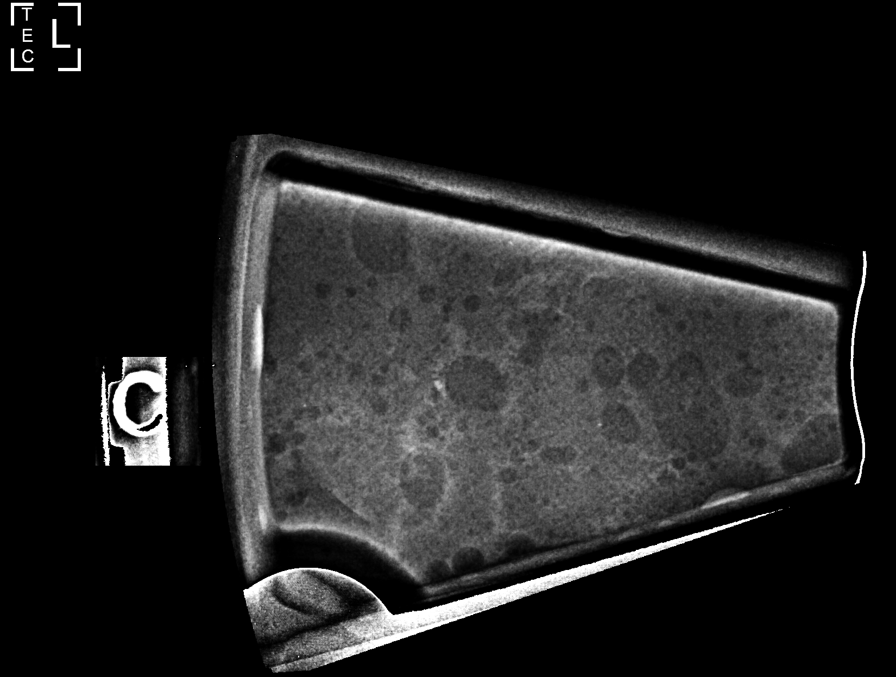

[L CC (1 of 3)]
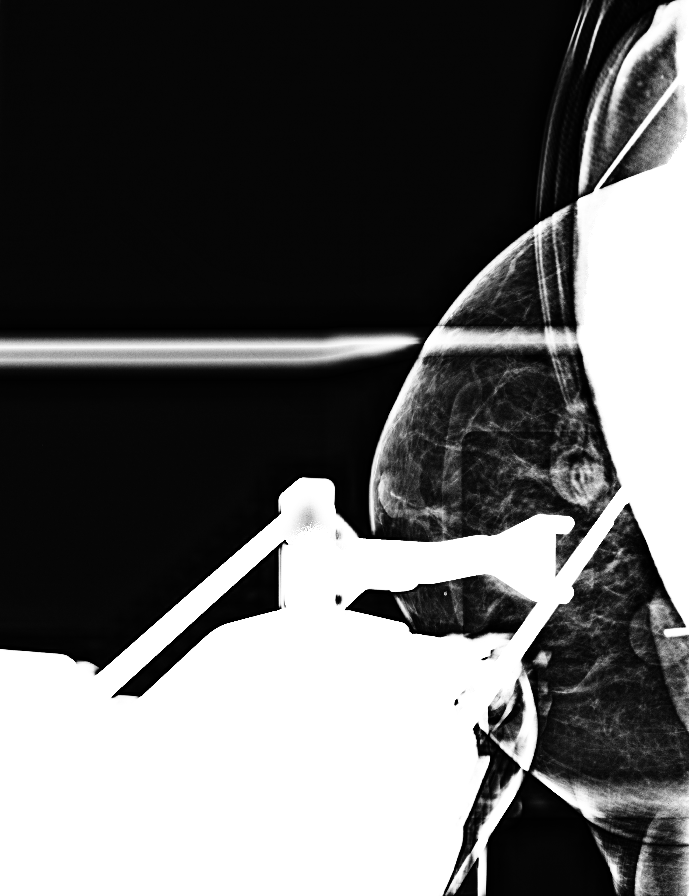

[L CC (2 of 3)]
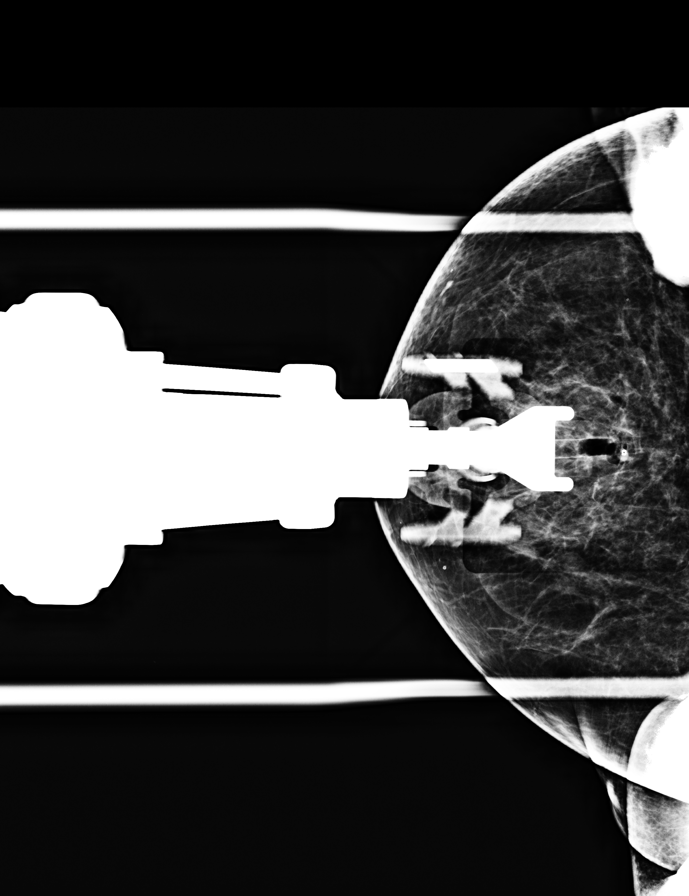

[L CC (3 of 3)]
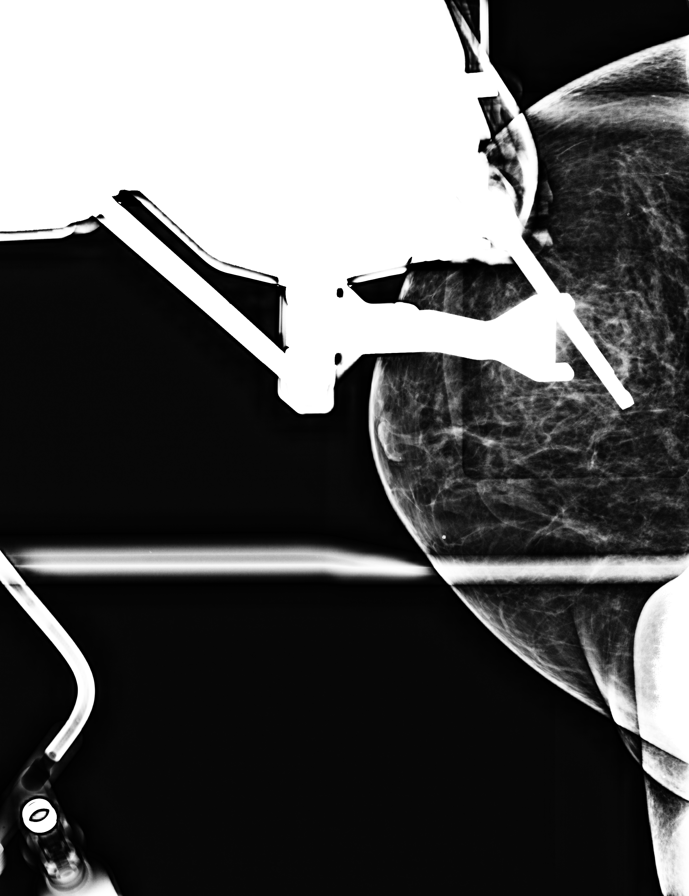

[8 of 18 positions shown; findings below may reference images not displayed]



Lesion quadrant: UPPER breast, 12 o'clock location, not localizing
to a quadrant.

Using sterile technique with chlorhexidine as skin antisepsis, 1%
lidocaine and 1% lidocaine with epinephrine as local anesthetic,
under stereotactic guidance, a 9 gauge Brevera vacuum assisted
device was used to perform core needle biopsy of the mass in the
UPPER LEFT breast using a superior approach. Specimen radiograph was
performed showing soft tissue density in multiple core samples.

At the conclusion of the procedure, a coil shaped tissue marker clip
was deployed into the biopsy cavity. Follow-up 2-view mammogram was
performed and dictated separately.
IMPRESSION: Stereotactic-guided biopsy of an indeterminate mass involving the
UPPER LEFT breast at POSTERIOR depth. No apparent complications.

ADDENDUM:
Pathology revealed GRADE I INVASIVE LOBULAR CARCINOMA of the LEFT
breast, 12 o'clock, upper, posterior depth. This was found to be
concordant by Dr. FUCHS.

Pathology results were discussed with the patient by telephone. The
patient reported doing well after the biopsy with tenderness at the
site. Post biopsy instructions and care were reviewed and questions
were answered. The patient was encouraged to call The [REDACTED]

The patient was referred to [REDACTED]
[REDACTED] at [REDACTED] on
[DATE].

Given lobular histology, MRI is recommended to determine extent of
disease (as lobular carcinoma is often underestimated on
mammography).

Pathology results reported by FUCHS RN on [DATE].



Lesion quadrant: UPPER breast, 12 o'clock location, not localizing
to a quadrant.

Using sterile technique with chlorhexidine as skin antisepsis, 1%
lidocaine and 1% lidocaine with epinephrine as local anesthetic,
under stereotactic guidance, a 9 gauge Brevera vacuum assisted
device was used to perform core needle biopsy of the mass in the
UPPER LEFT breast using a superior approach. Specimen radiograph was
performed showing soft tissue density in multiple core samples.

At the conclusion of the procedure, a coil shaped tissue marker clip
was deployed into the biopsy cavity. Follow-up 2-view mammogram was
performed and dictated separately.
IMPRESSION: Stereotactic-guided biopsy of an indeterminate mass involving the
UPPER LEFT breast at POSTERIOR depth. No apparent complications.

## 2020-07-21 IMAGING — CT CT ABD-PELV W/ CM
1 of 3 series · 14 of 32 positions shown, 19 images · IV contrast (APPLIED)
Comparison: None.

CLINICAL DATA: Diarrhea, weight loss

EXAM:
CT ABDOMEN AND PELVIS WITH CONTRAST
TECHNIQUE: Multidetector CT imaging of the abdomen and pelvis was performed
using the standard protocol following bolus administration of
intravenous contrast.
CONTRAST:  100mL [MG] IOPAMIDOL ([MG]) INJECTION 61%

[Series 2: abd/pelvis w/cm · axial · 0.82mm/px · z∈[-430,-50]mm · 14 of 88 slices shown, 19 images]
[im 6/88  soft-tissue]
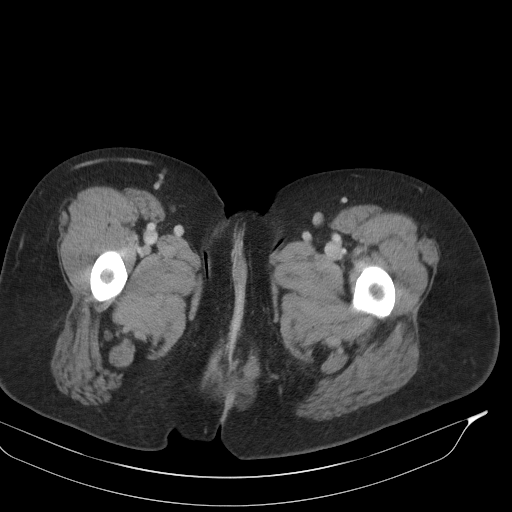
[im 6/88  bone]
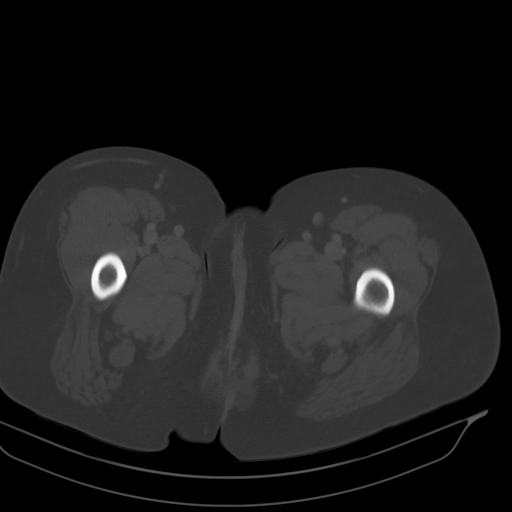
[im 11/88  soft-tissue]
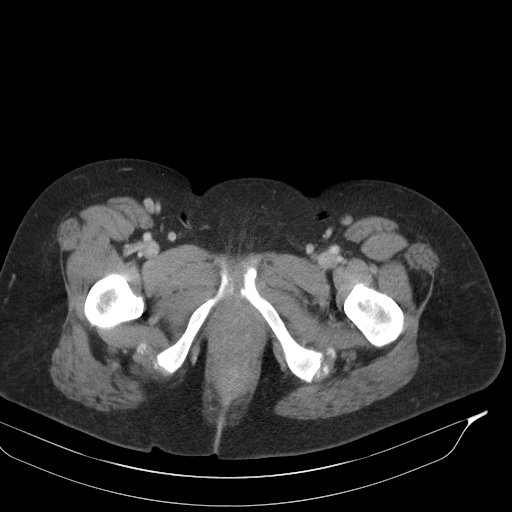
[im 17/88  soft-tissue]
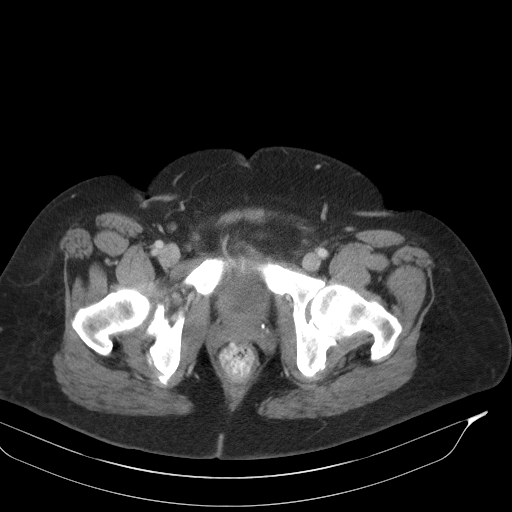
[im 28/88  soft-tissue]
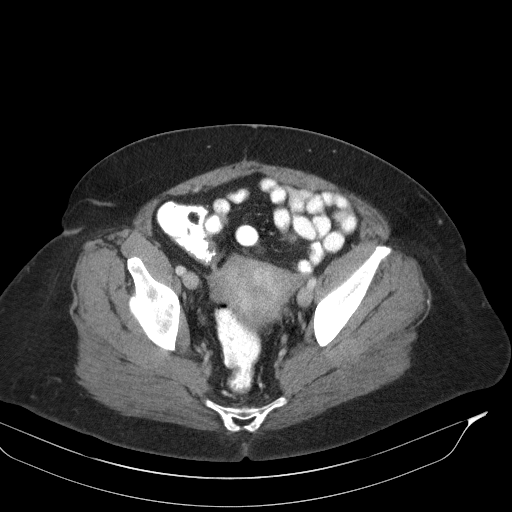
[im 33/88  soft-tissue]
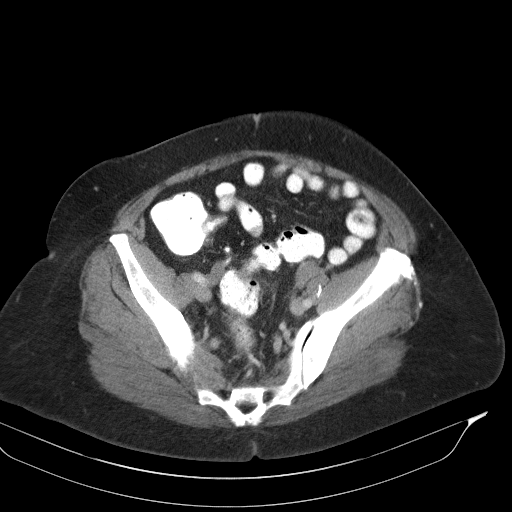
[im 39/88  soft-tissue]
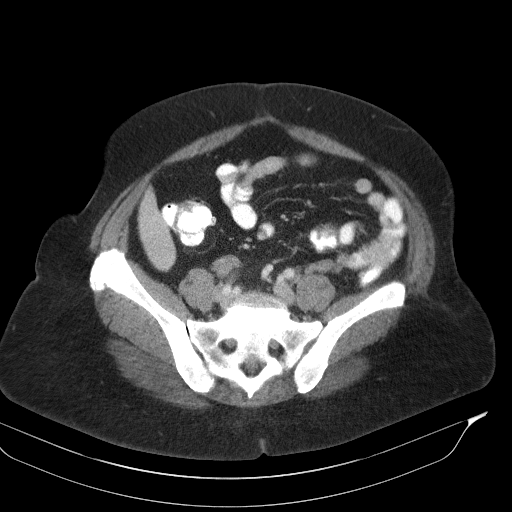
[im 44/88  soft-tissue]
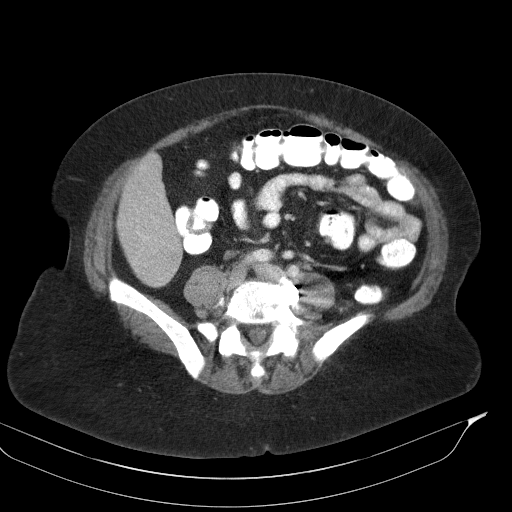
[im 49/88  soft-tissue]
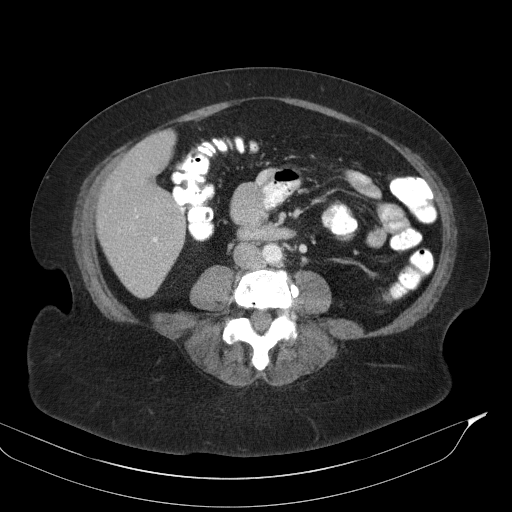
[im 55/88  soft-tissue]
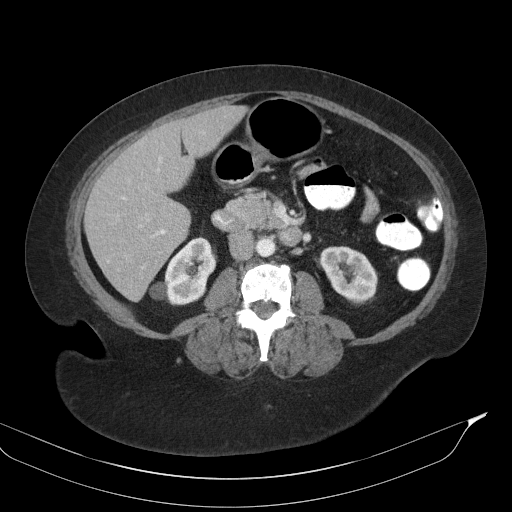
[im 55/88  bone]
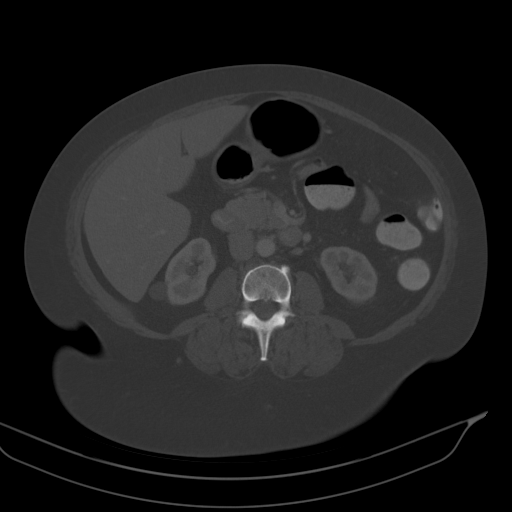
[im 60/88  soft-tissue]
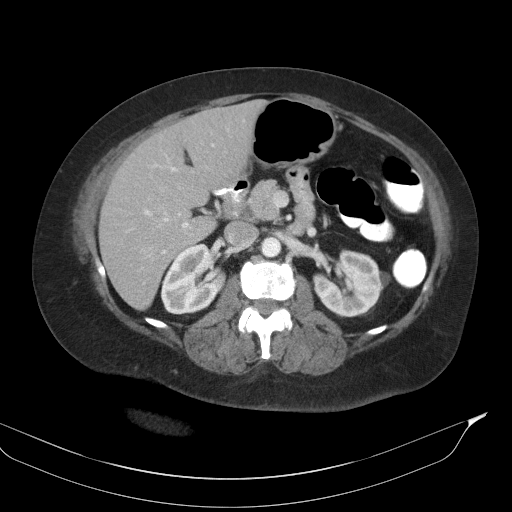
[im 66/88  lung]
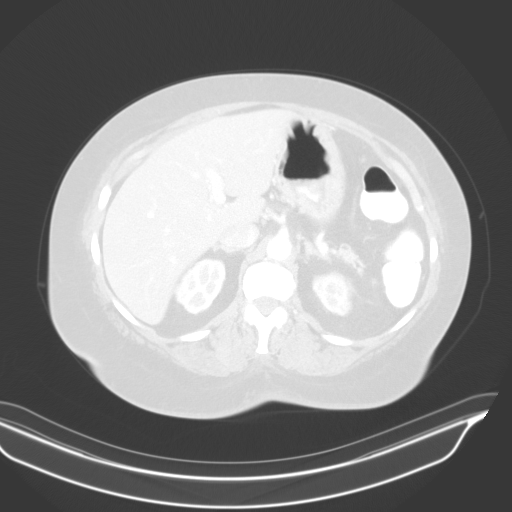
[im 71/88  soft-tissue]
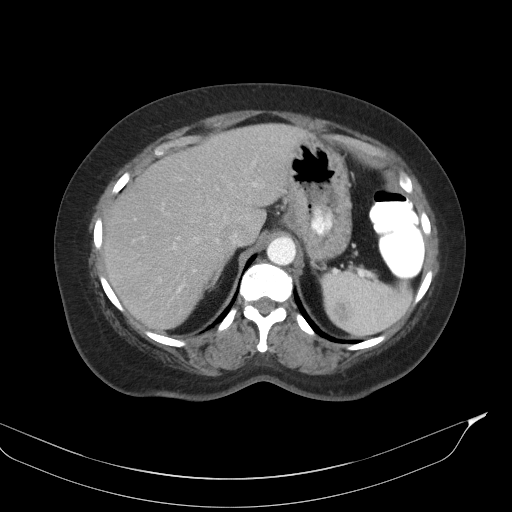
[im 71/88  lung]
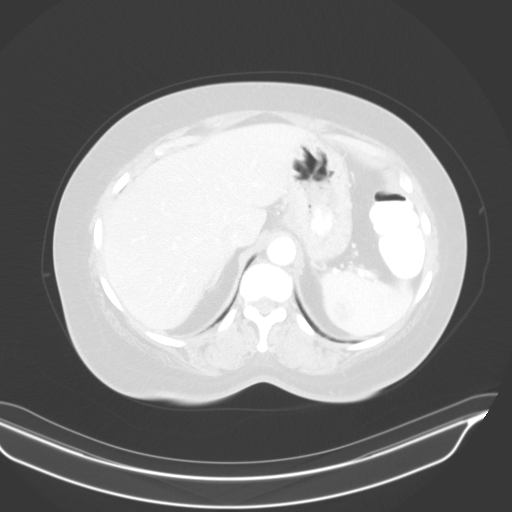
[im 77/88  soft-tissue]
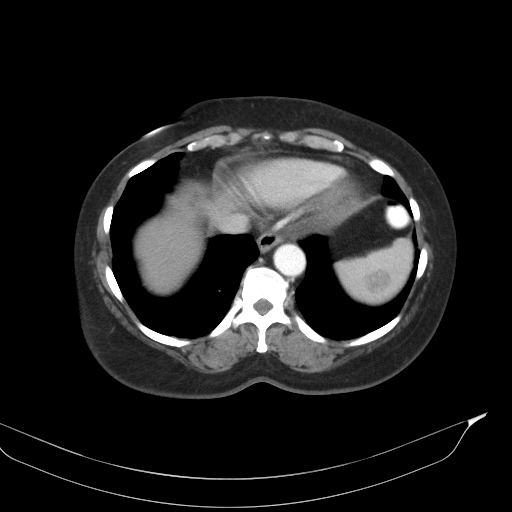
[im 77/88  lung]
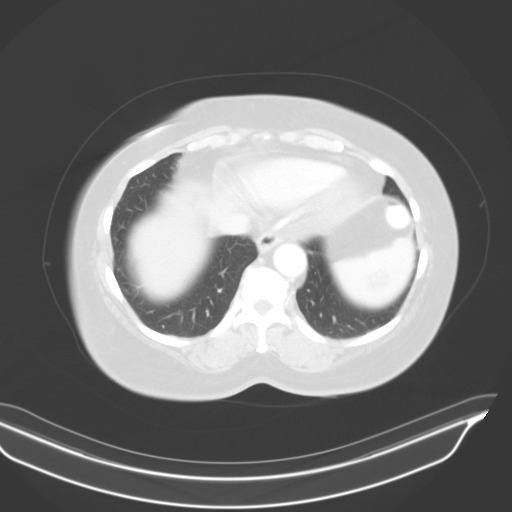
[im 82/88  soft-tissue]
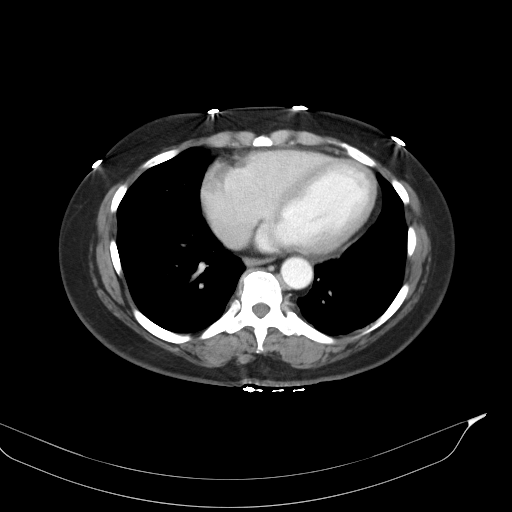
[im 82/88  lung]
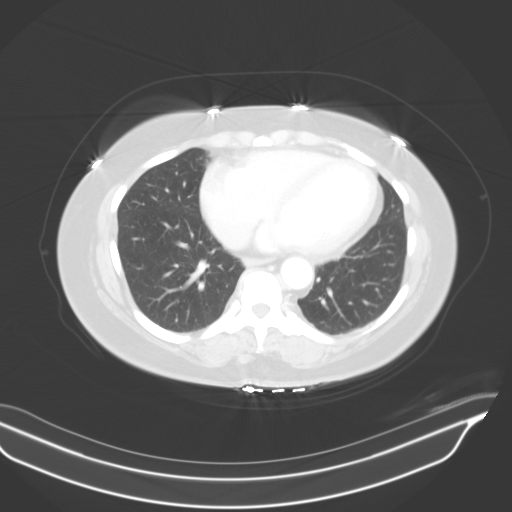

[14 of 32 positions shown; findings below may reference images not displayed]

FINDINGS: Lower chest: No pleural or pericardial effusion. Visualized lung
bases clear.

Hepatobiliary: 0.8 cm low-attenuation lesion in hepatic segment 8
possibly cyst 2 nonspecific splenic lesions. But incompletely
characterized. Liver otherwise unremarkable. Cholecystectomy clips.
No biliary ductal dilatation.

Pancreas: Unremarkable. No pancreatic ductal dilatation or
surrounding inflammatory changes.

Spleen: Normal in size. Well demarcated 2.2 and 2.7 cm
low-attenuation splenic lesions, nonspecific.

Adrenals/Urinary Tract: Adrenal glands unremarkable. 1.5 cm
exophytic cyst from the lower pole right kidney. 1.7 cm exophytic
cyst from the upper pole right kidney. No renal mass or
hydronephrosis. Urinary bladder incompletely distended.

Stomach/Bowel: Stomach is incompletely distended. Small bowel is
nondilated. Normal appendix. The colon is nondilated, unremarkable.

Vascular/Lymphatic: No significant vascular findings are present. No
enlarged abdominal or pelvic lymph nodes.

Reproductive: Uterus and bilateral adnexa are unremarkable.

Other: Bilateral pelvic phleboliths.  No ascites.  No free air.

Musculoskeletal: Solid-appearing instrumented interbody fusion L4-5.
no fracture or worrisome bone lesion.
IMPRESSION: 1. Nonspecific splenic lesions as above. While most incidental
splenic lesions are benign, lymphoma would be a consideration in
patient with constitutional symptoms. MR abdomen with contrast or
PET-CT may be useful for further characterization.

## 2020-08-09 IMAGING — MR MR BREAST BILAT WO/W CM
8 of 11 series · 30 of 48 positions shown · IV contrast (gadavist)
Comparison: Previous exam(s).

CLINICAL DATA: Recent stereotactic guided core biopsy of mass in
the LEFT breast shows grade 1 invasive lobular carcinoma.

LABS:  None obtained at the time of imaging.
EXAM:
BILATERAL BREAST MRI WITH AND WITHOUT CONTRAST
TECHNIQUE: Multiplanar, multisequence MR images of both breasts were obtained
prior to and following the intravenous administration of 8 ml of
Gadavist

[Series 2: T2 · axial · 3.0mm · 0.83mm/px · z∈[-68,+94]mm · 4 of 55 slices shown]
[im 1/55]
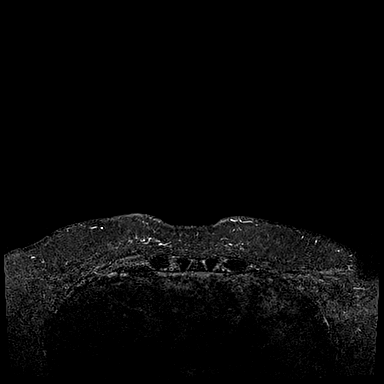
[im 19/55]
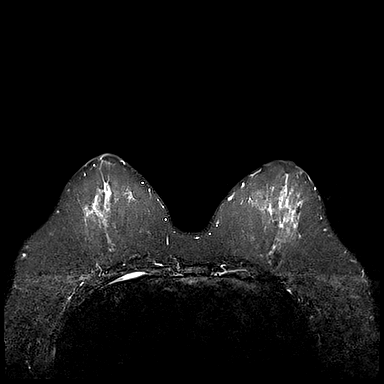
[im 37/55]
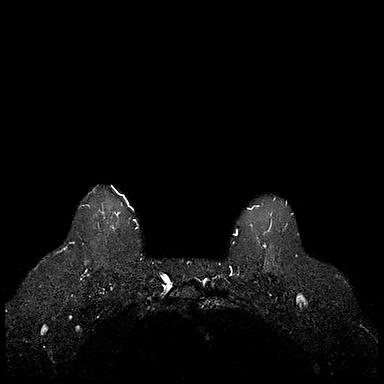
[im 55/55]
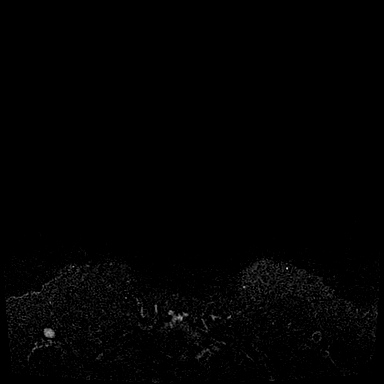

[Series 3: T1 fat-sat · axial · 1.2mm · 0.71mm/px · z∈[-72,+99]mm · 7 of 141 slices shown (1 of 4)]
[im 1/141]
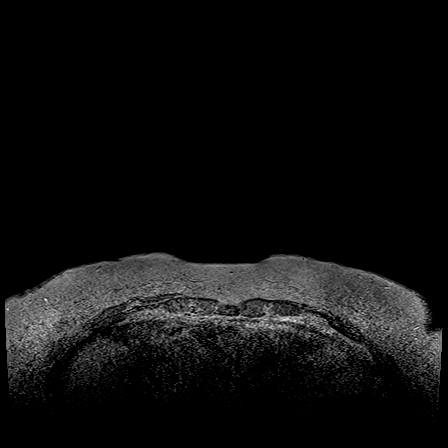
[im 24/141]
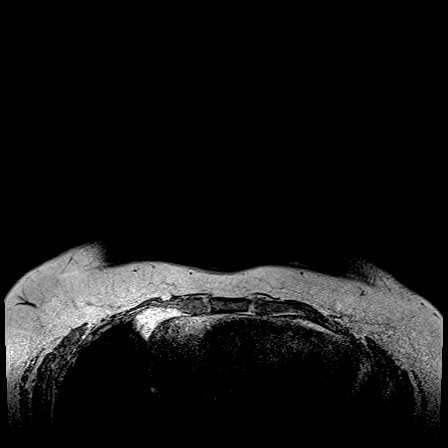
[im 47/141]
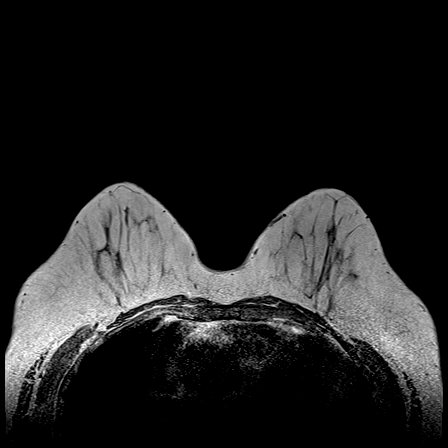
[im 71/141]
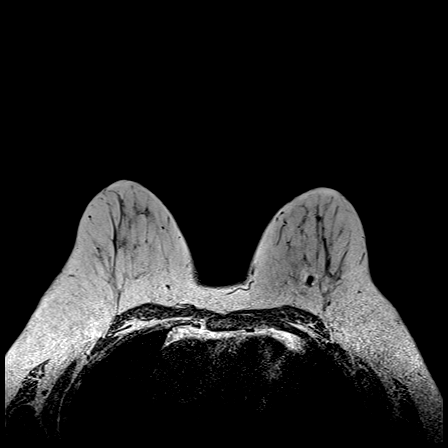
[im 94/141]
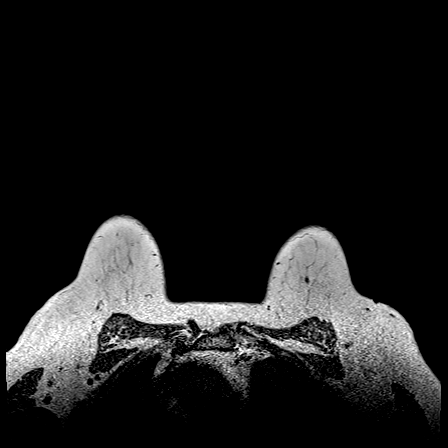
[im 117/141]
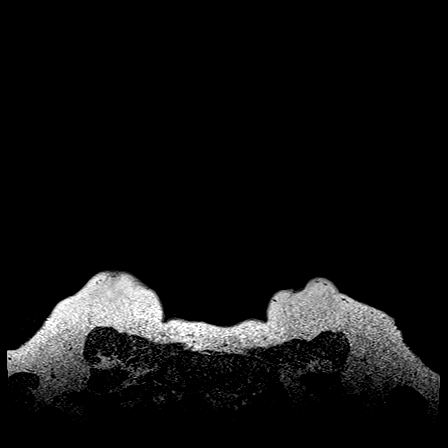
[im 141/141]
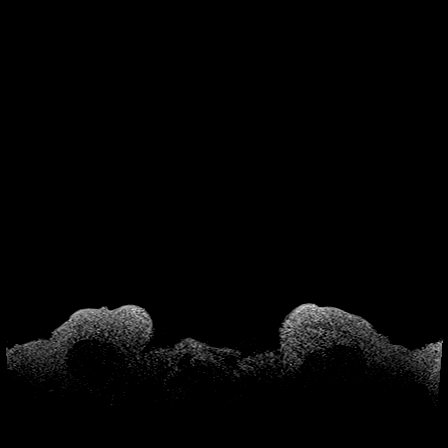

[Series 5: T1 fat-sat · axial · 1.6mm · 0.77mm/px · z∈[-80,+97]mm · 5 of 112 slices shown (2 of 4)]
[im 1/112]
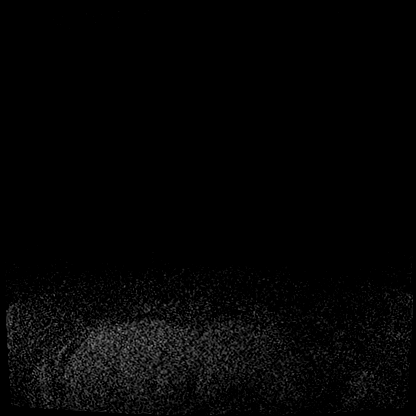
[im 28/112]
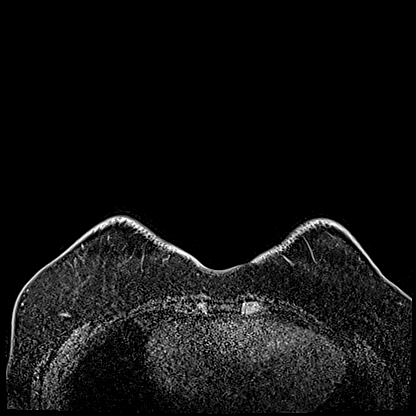
[im 56/112]
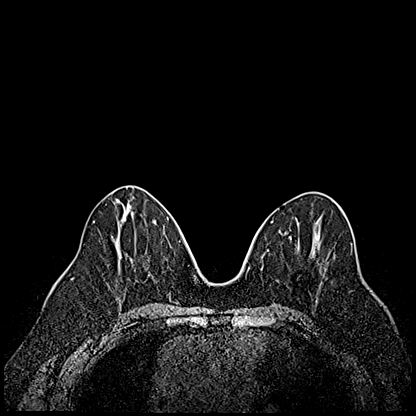
[im 84/112]
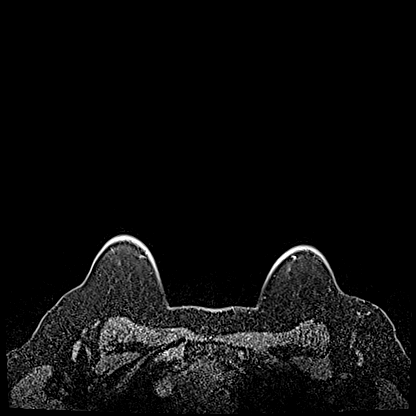
[im 112/112]
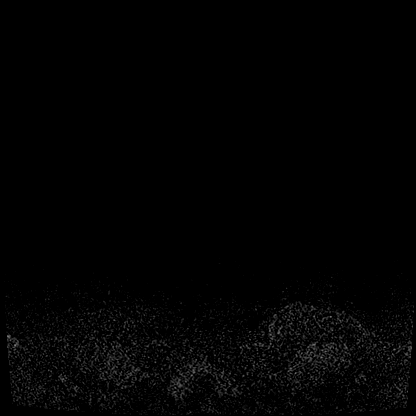

[Series 6: T1 fat-sat · axial · 1.6mm · 0.77mm/px · z∈[-80,+97]mm · 5 of 112 slices shown (3 of 4)]
[im 1/112]
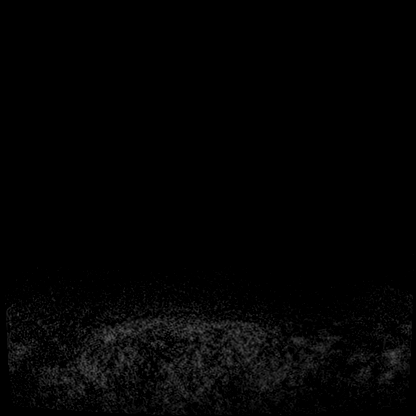
[im 28/112]
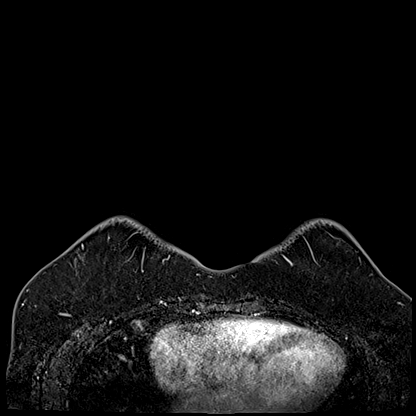
[im 56/112]
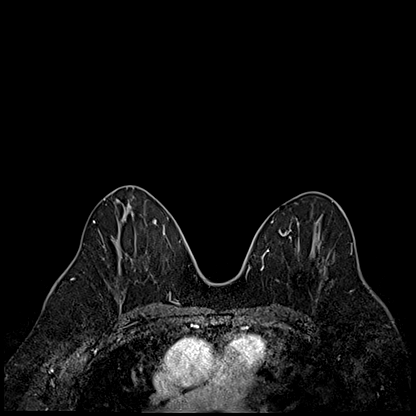
[im 84/112]
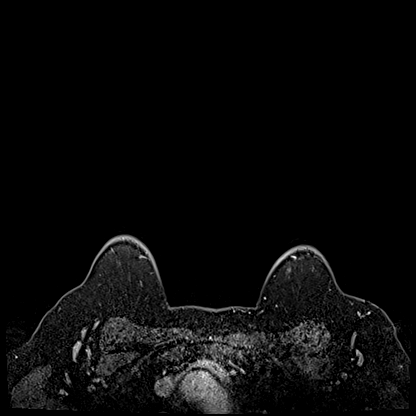
[im 112/112]
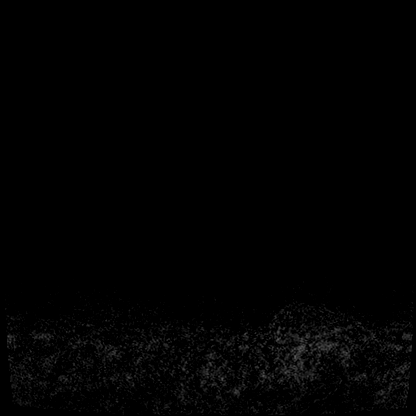

[Series 7: T1 · axial · 1.6mm · 0.77mm/px · z∈[-80,+97]mm · 5 of 112 slices shown (1 of 3)]
[im 1/112]
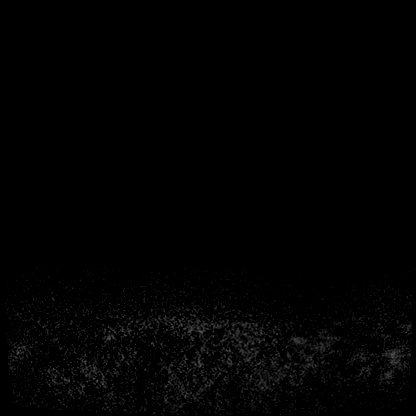
[im 28/112]
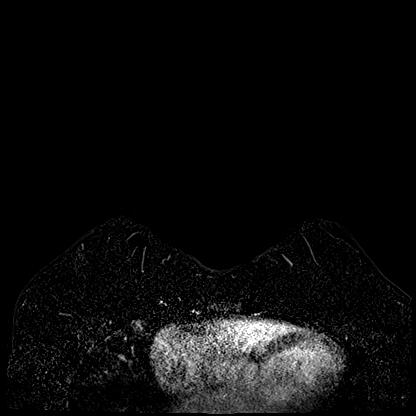
[im 56/112]
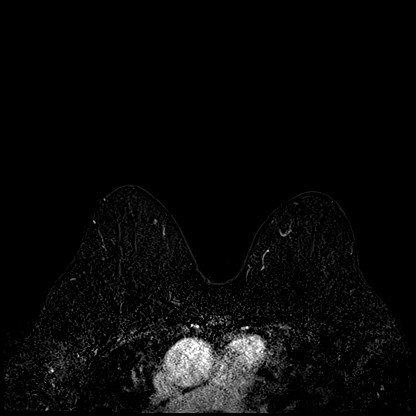
[im 84/112]
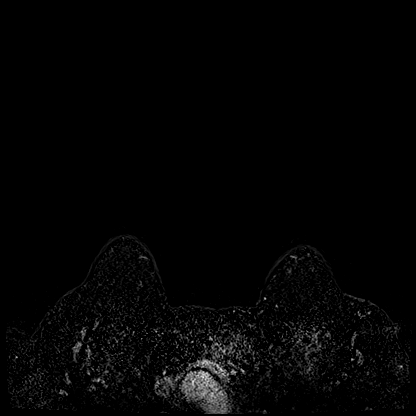
[im 112/112]
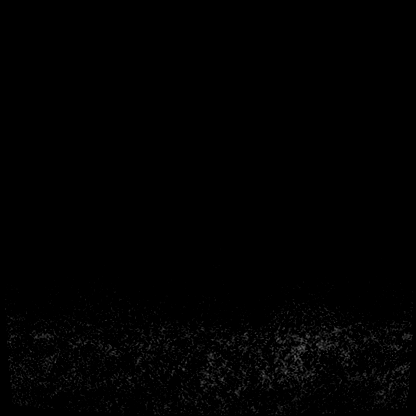

[Series 8: T1 · coronal · 320.0mm · 0.77mm/px · 1 of 3 slices shown (2 of 3)]
[im 1/3]
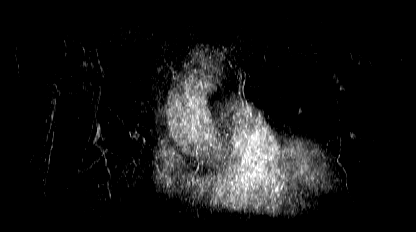

[Series 9: T1 · axial · 179.2mm · 0.77mm/px · 1 of 3 slices shown (3 of 3)]
[im 1/3]
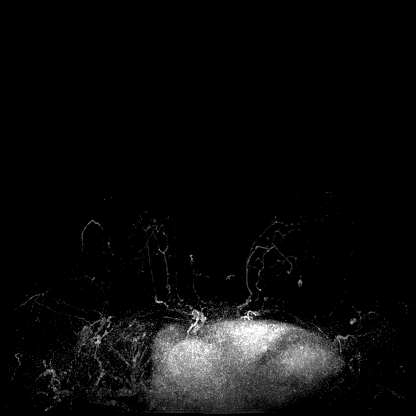

[Series 10: T1 fat-sat · axial · 1.6mm · 0.77mm/px · z∈[-80,-37]mm · 2 of 112 slices shown (4 of 4)]
[im 1/112]
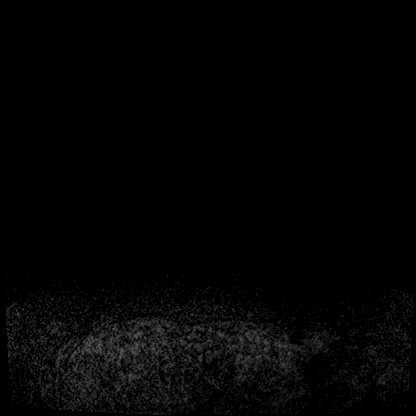
[im 28/112]
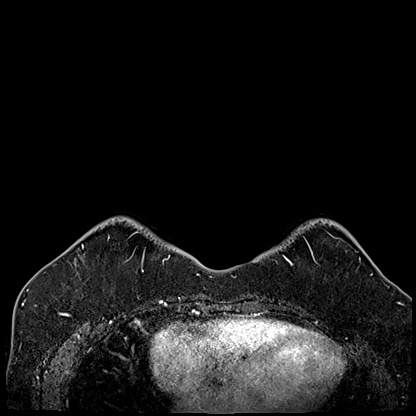

[30 of 48 positions shown; findings below may reference images not displayed]

Three-dimensional MR images were rendered by post-processing of the
original MR data on an independent workstation. The
three-dimensional MR images were interpreted, and findings are
reported in the following complete MRI report for this study. Three
dimensional images were evaluated at the independent interpreting
workstation using the DynaCAD thin client.
FINDINGS: Breast composition: c. Heterogeneous fibroglandular tissue.

Background parenchymal enhancement: Minimal

Right breast: No mass or abnormal enhancement.

Left breast: In the UPPER central portion of the LEFT breast, there
is an enhancing mass adjacent to tissue marker clip artifact placed
at the time of recent biopsy. Mass shows washout type kinetics it is
consistent with known malignancy. No other abnormalities are
identified in the LEFT breast.

Lymph nodes: No abnormal appearing lymph nodes.

Ancillary findings:  None.
IMPRESSION: Solitary 7 millimeter mass in the UPPER central portion of the LEFT
breast, consistent with known malignancy.

RIGHT breast is negative.

RECOMMENDATION:
Treatment plan for known LEFT breast malignancy.

BI-RADS CATEGORY  6: Known biopsy-proven malignancy.

## 2020-08-16 IMAGING — MG MM PLC BREAST LOC DEV 1ST LESION INC MAMMO GUIDE*L*
8 of 12 series · 8 of 12 positions shown · non-contrast
Comparison: Previous exam(s).

CLINICAL DATA: Patient presents for radioactive seed localization
of biopsy-proven invasive lobular carcinoma upper central left
breast marked by coil clip.

EXAM:
MAMMOGRAPHIC GUIDED RADIOACTIVE SEED LOCALIZATION OF THE LEFT BREAST

[L LM (1 of 4)]
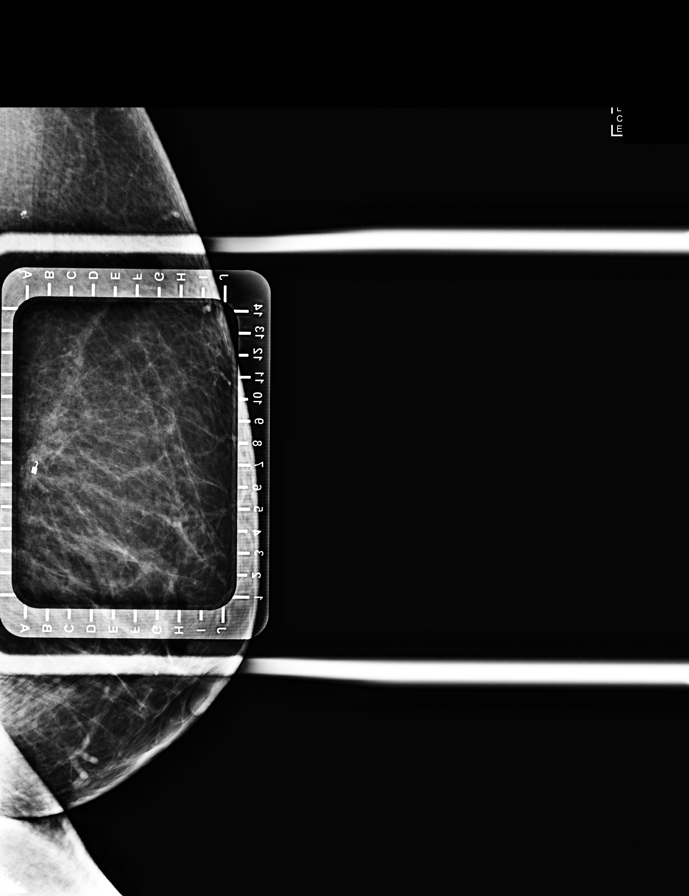

[L LM (2 of 4)]
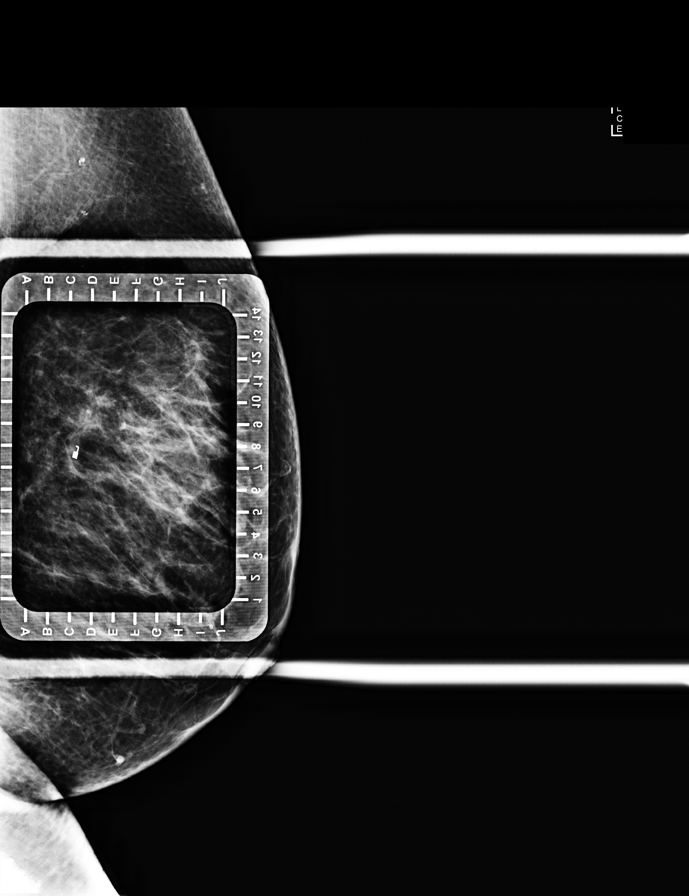

[L CC (1 of 4)]
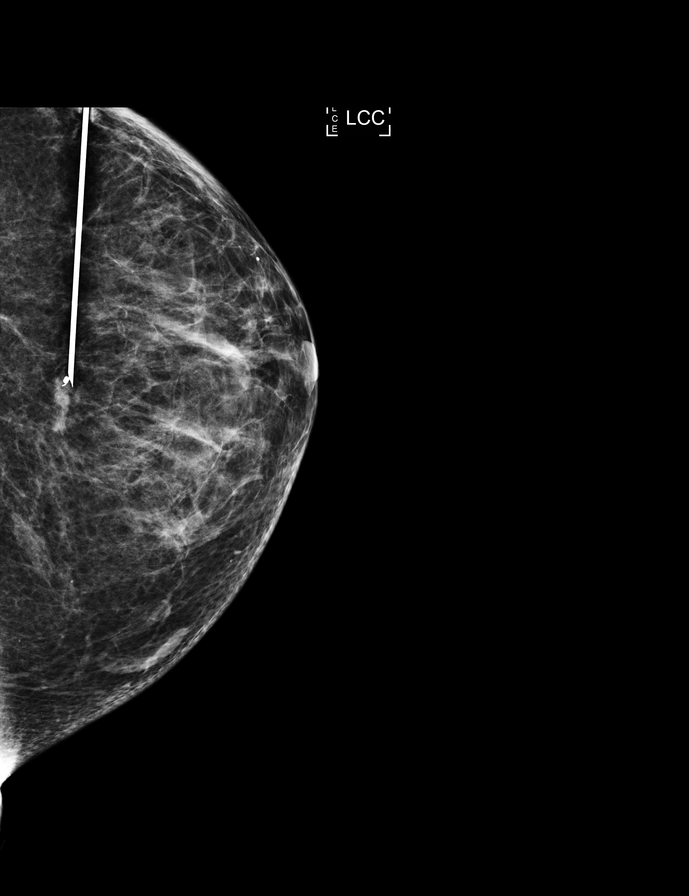

[L CC (2 of 4)]
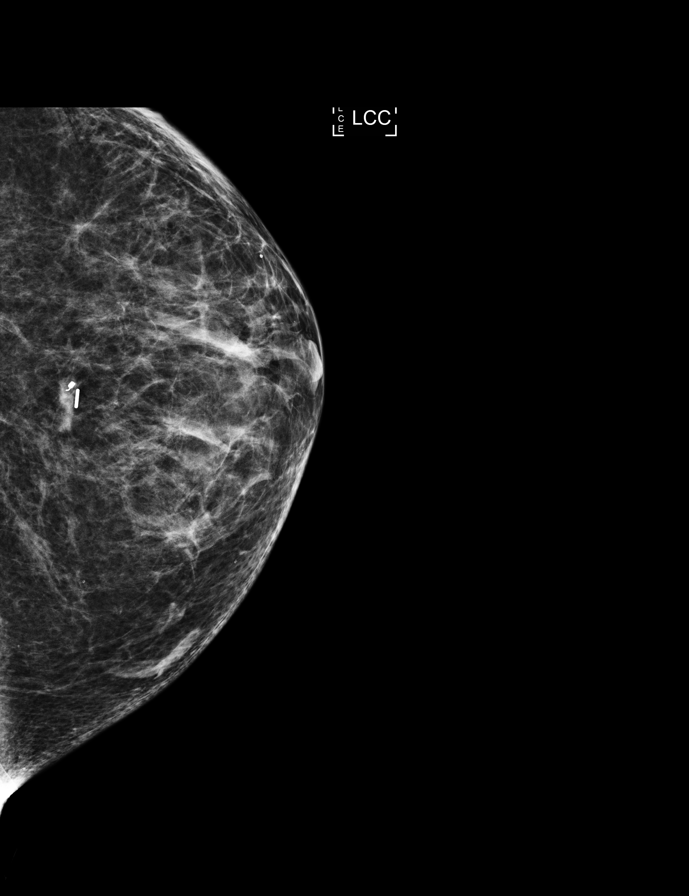

[L LM (3 of 4)]
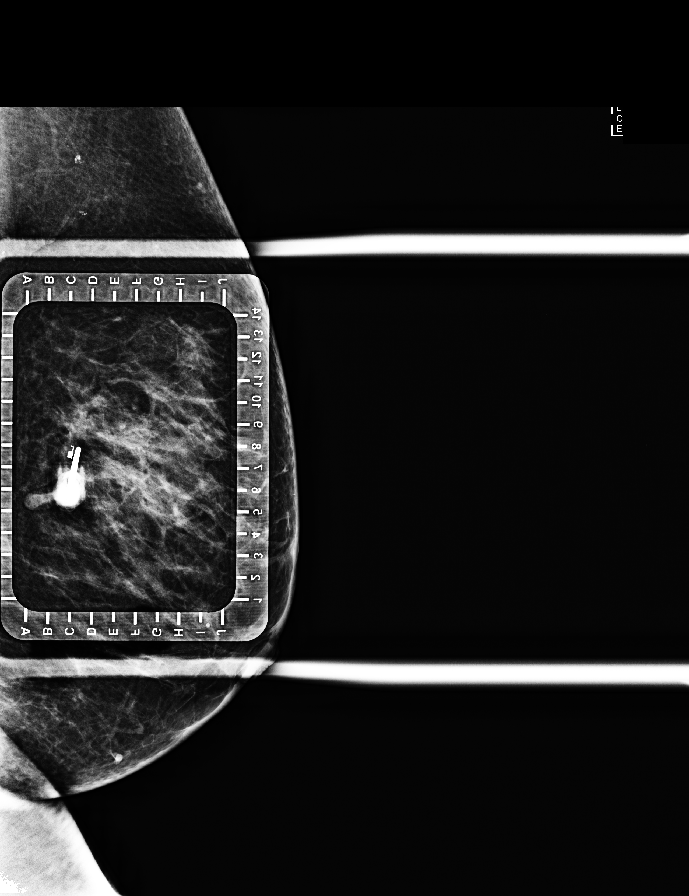

[L CC (3 of 4)]
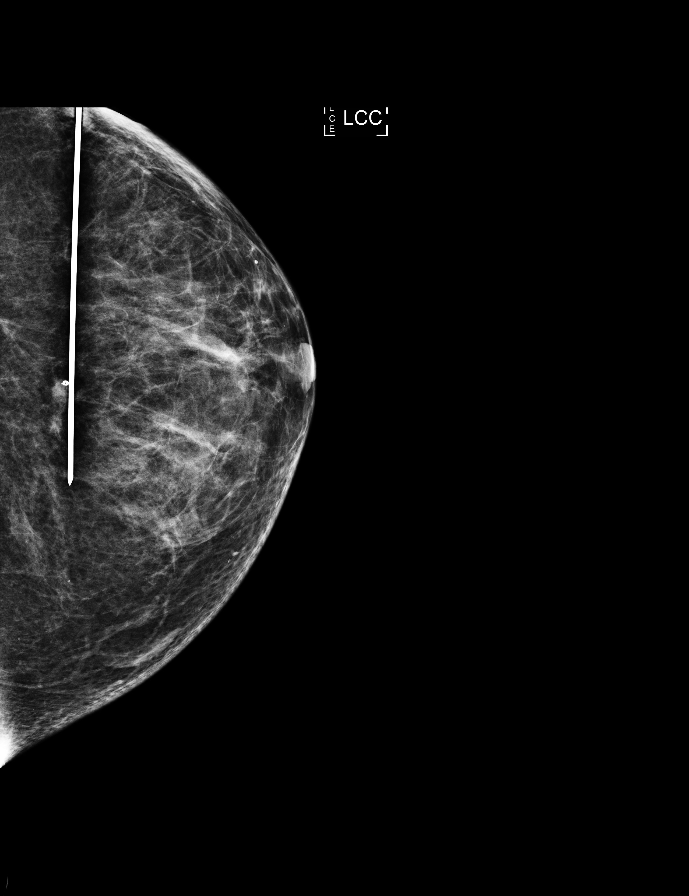

[L CC (4 of 4)]
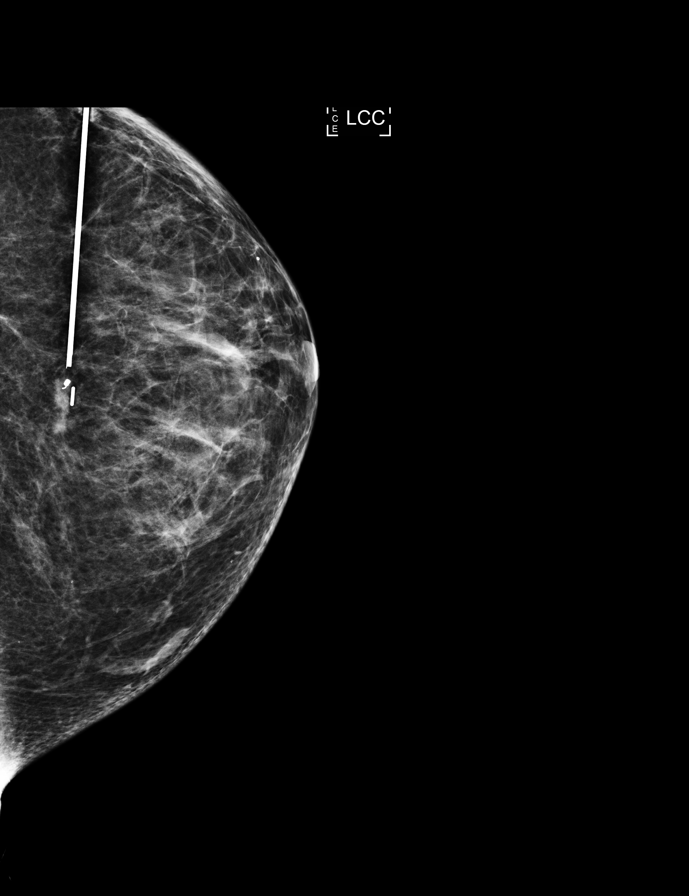

[L LM (4 of 4)]
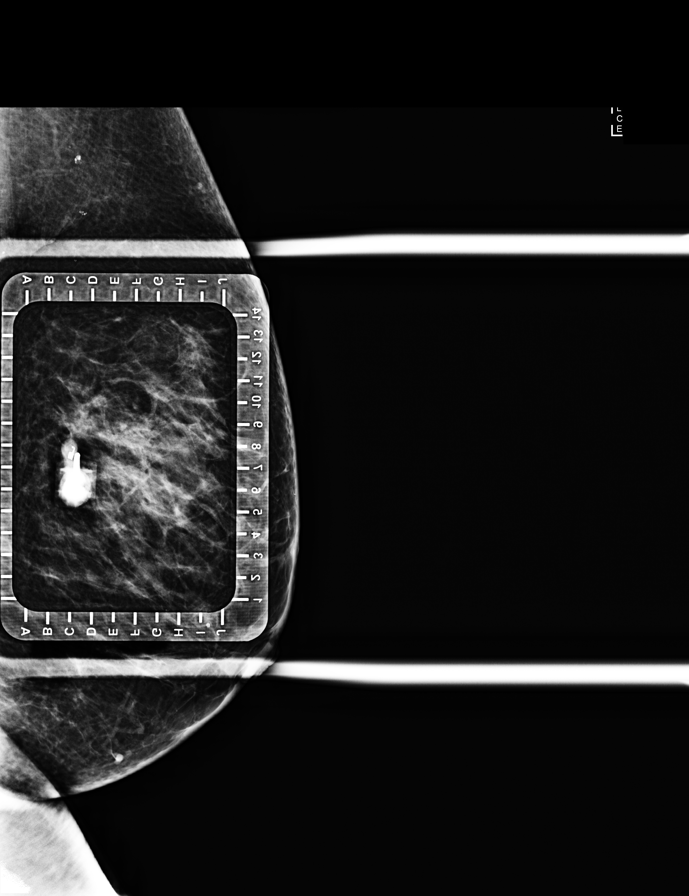

[8 of 12 positions shown; findings below may reference images not displayed]

FINDINGS: Patient presents for radioactive seed localization prior to surgical
excision. I met with the patient and we discussed the procedure of
seed localization including benefits and alternatives. We discussed
the high likelihood of a successful procedure. We discussed the
risks of the procedure including infection, bleeding, tissue injury
and further surgery. We discussed the low dose of radioactivity
involved in the procedure. Informed, written consent was given.

The usual time-out protocol was performed immediately prior to the
procedure.

Using mammographic guidance, sterile technique, 1% lidocaine and an
[P4] radioactive seed, the targeted mass/coil clip was localized
using a lateral to medial approach. The follow-up mammogram images
confirm the seed in the expected location and were marked for Dr.
GALO EDGAR. Follow-up survey of the patient confirms presence of the
radioactive seed.

Order number of [P4] seed:  [PHONE_NUMBER].

Total activity:  0.246 millicurie reference Date: [DATE]

The patient tolerated the procedure well and was released from the
[REDACTED]. She was given instructions regarding seed removal.
IMPRESSION: Radioactive seed localization left breast. No apparent
complications.

## 2020-08-17 IMAGING — MG MM BREAST SURGICAL SPECIMEN
1 series · 1 of 1 positions shown · non-contrast
Comparison: Previous exam(s).

CLINICAL DATA: Biopsy-proven invasive lobular carcinoma involving
the LEFT breast. Radioactive seed localization was performed
yesterday in anticipation of today's lumpectomy.

EXAM:
SPECIMEN RADIOGRAPH OF THE LEFT BREAST

[L]
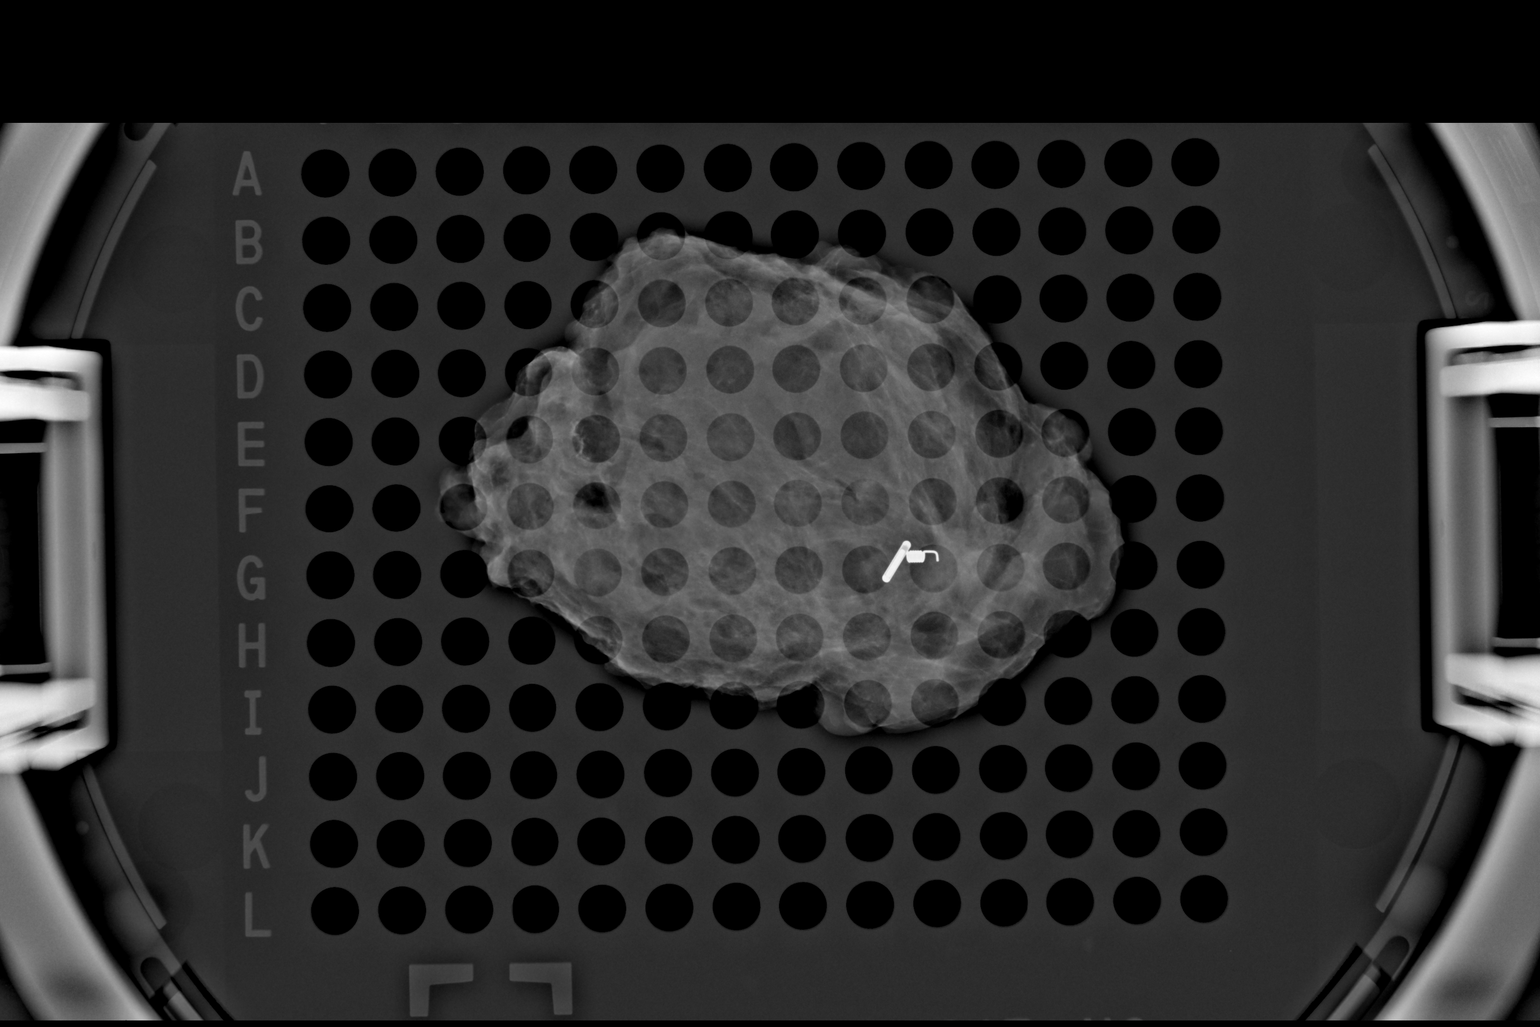

[1 of 1 positions shown; findings below may reference images not displayed]

FINDINGS: Status post excision of the LEFT breast. The radioactive seed and
the coil shaped tissue marker clip are present, completely intact,
and are marked for pathology. This was discussed by telephone with
the operating room nurse at time interpretation [DATE] at [DATE]
a.m.
IMPRESSION: Specimen radiograph of the LEFT breast.

## 2020-08-21 IMAGING — MR MR ABDOMEN WO/W CM
10 of 17 series · 27 of 48 positions shown · IV contrast (multihance)
Comparison: [DATE] CT evaluation

CLINICAL DATA: Epigastric pain, weight loss and nonspecific splenic
lesion on CT. 59-year-old female with recent diagnosis of breast
cancer.

EXAM:
MRI ABDOMEN WITHOUT AND WITH CONTRAST
TECHNIQUE: Multiplanar multisequence MR imaging of the abdomen was performed
both before and after the administration of intravenous contrast.
CONTRAST:  16mL MULTIHANCE GADOBENATE DIMEGLUMINE 529 MG/ML IV SOLN

[Series 3: cor haste · coronal · 5.0mm · 0.68mm/px · 2 of 33 slices shown]
[im 1/33]
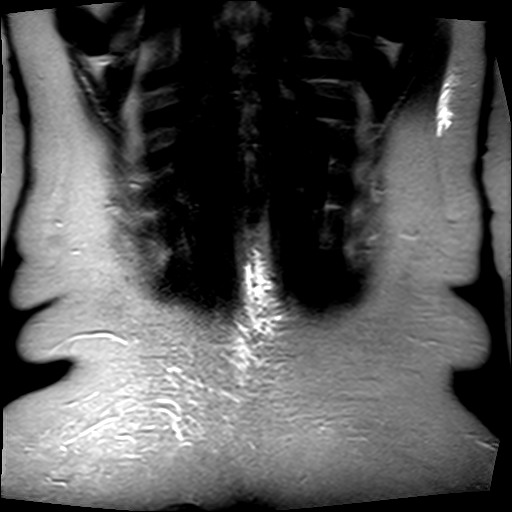
[im 33/33]
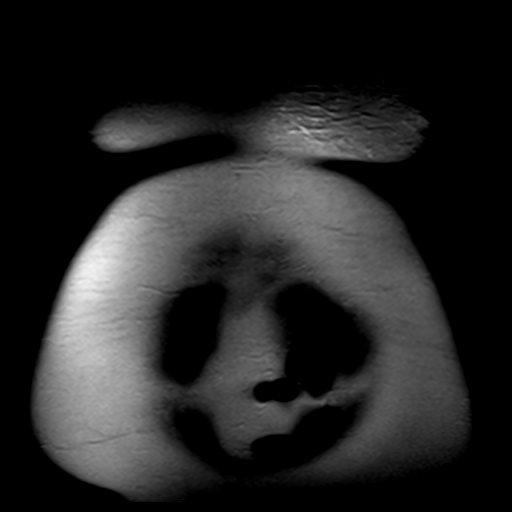

[Series 4: axial haste · axial · 6.0mm · 0.68mm/px · z∈[-140,+104]mm · 2 of 38 slices shown]
[im 1/38]
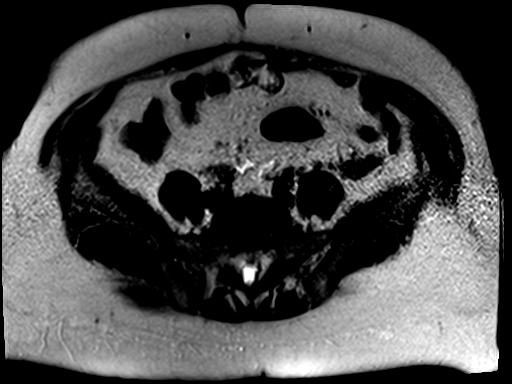
[im 38/38]
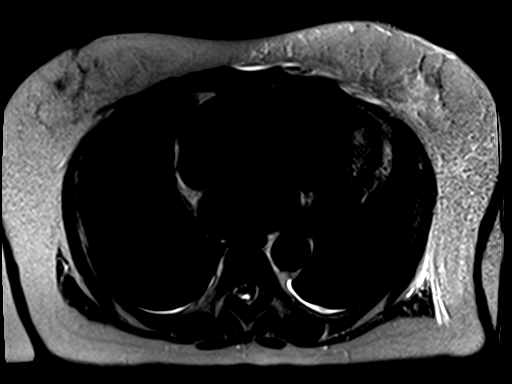

[Series 5: T1 · axial · 6.0mm · 0.68mm/px · z∈[-139,+92]mm · 4 of 72 slices shown]
[im 1/72]
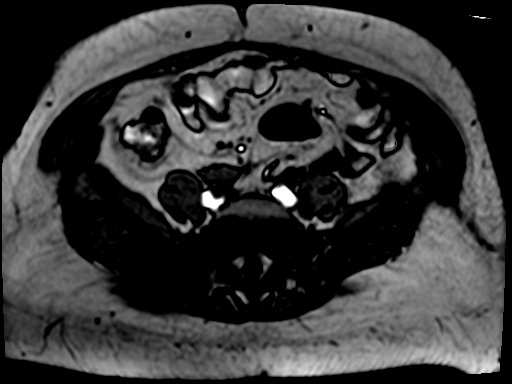
[im 24/72]
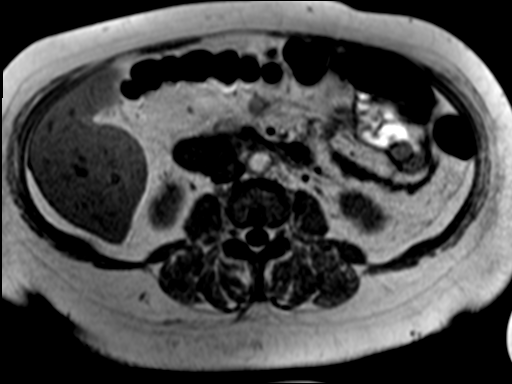
[im 48/72]
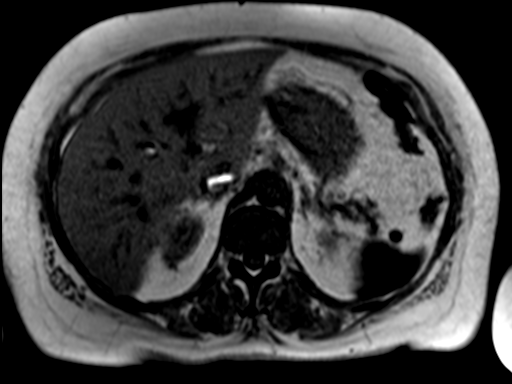
[im 72/72]
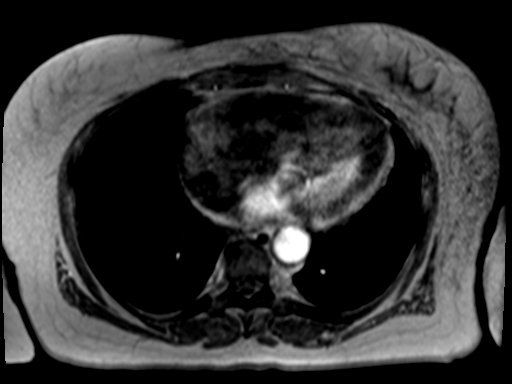

[Series 6: bSSFP · axial · 4.0mm · 0.68mm/px · z∈[-138,+102]mm · 3 of 61 slices shown]
[im 1/61]
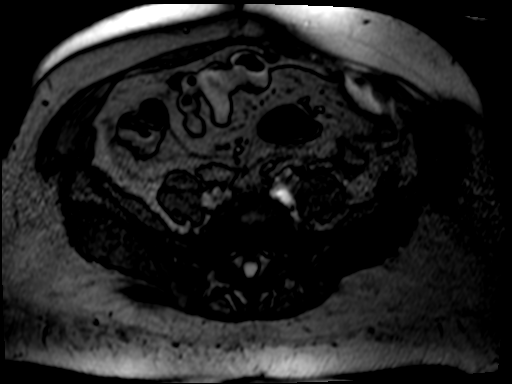
[im 31/61]
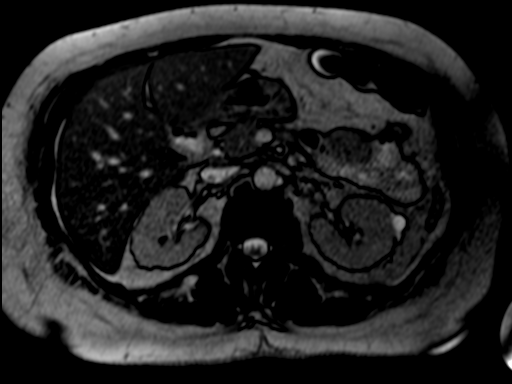
[im 61/61]
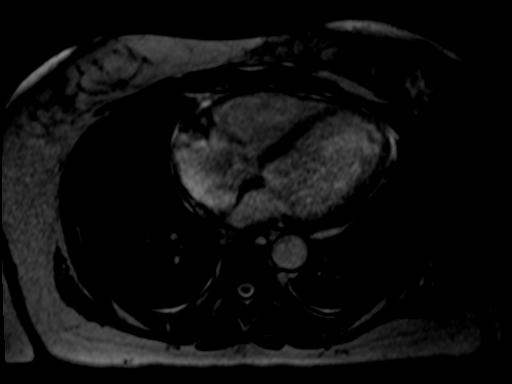

[Series 7: T2 · axial · 6.0mm · 1.19mm/px · z∈[-142,+103]mm · 2 of 35 slices shown]
[im 1/35]
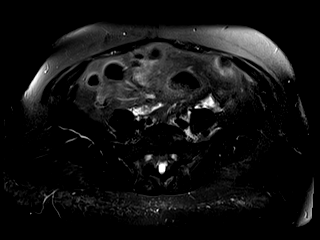
[im 35/35]
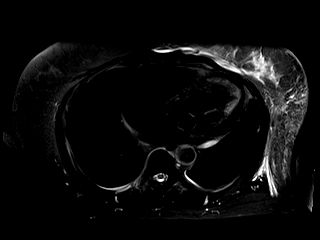

[Series 8: ep2d_diff_b50_500_800_p2_trig · axial · 6.0mm · 1.98mm/px · z∈[-153,+114]mm · 4 of 114 slices shown]
[im 1/114]
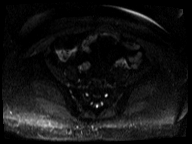
[im 38/114]
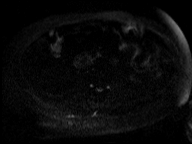
[im 76/114]
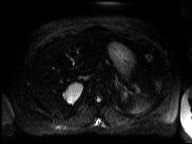
[im 114/114]
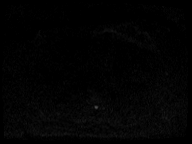

[Series 9: ep2d_[id number]_500_800_p2_trig_adc · axial · 6.0mm · 1.98mm/px · 1 of 38 slices shown]
[im 1/38]
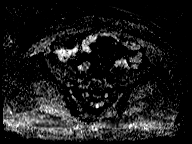

[Series 10: T1 dynamic · axial · non-contrast · 2.5mm · 0.74mm/px · z∈[-142,+95]mm · 3 of 96 slices shown]
[im 1/96]
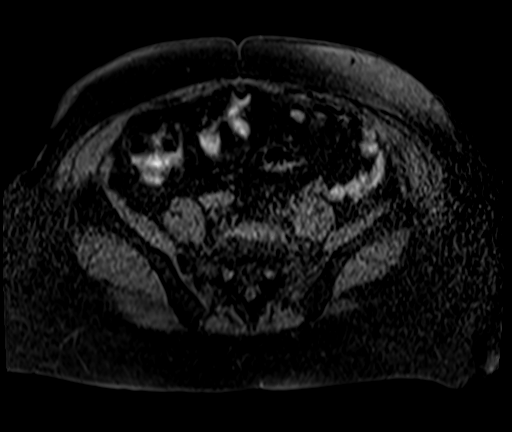
[im 48/96]
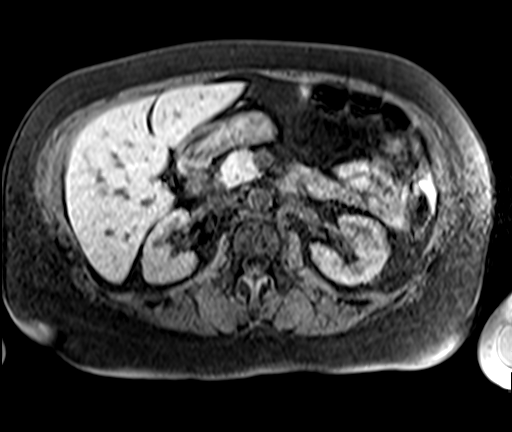
[im 96/96]
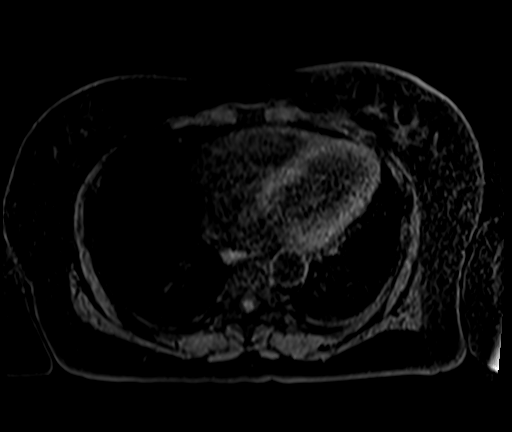

[Series 11: T1 dynamic post-contrast · axial · 2.5mm · 0.74mm/px · z∈[-142,+95]mm · 3 of 96 slices shown (1 of 2)]
[im 1/96]
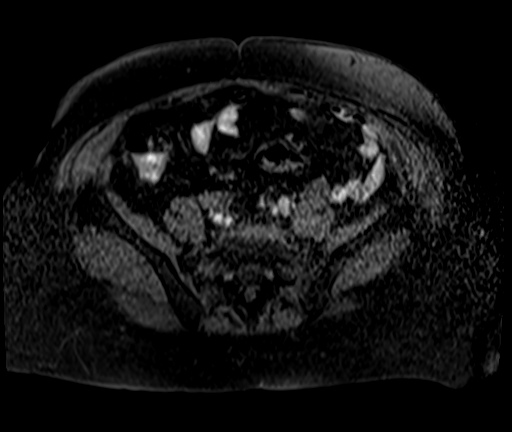
[im 48/96]
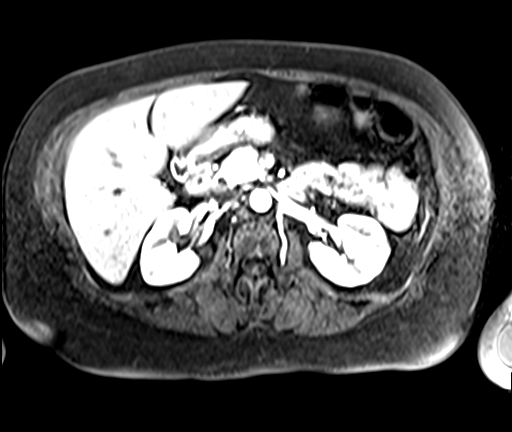
[im 96/96]
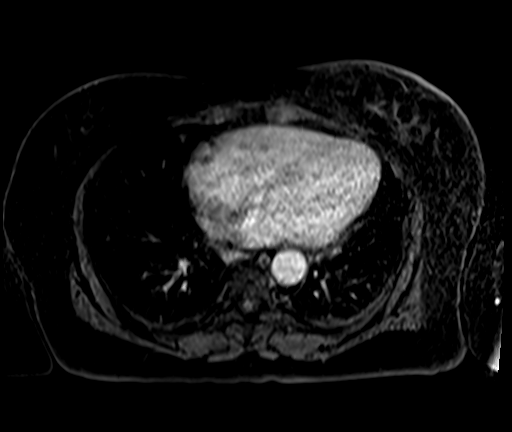

[Series 12: T1 dynamic post-contrast · axial · 2.5mm · 0.74mm/px · z∈[-142,+95]mm · 3 of 96 slices shown (2 of 2)]
[im 1/96]
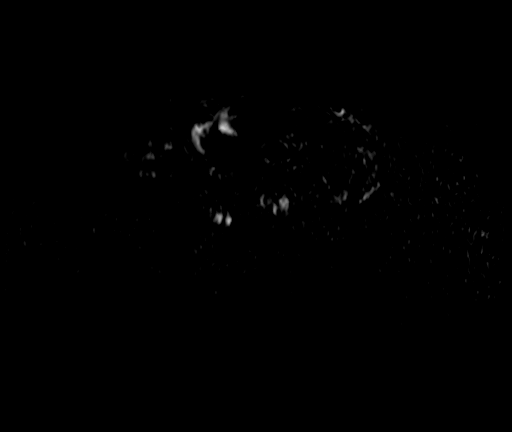
[im 48/96]
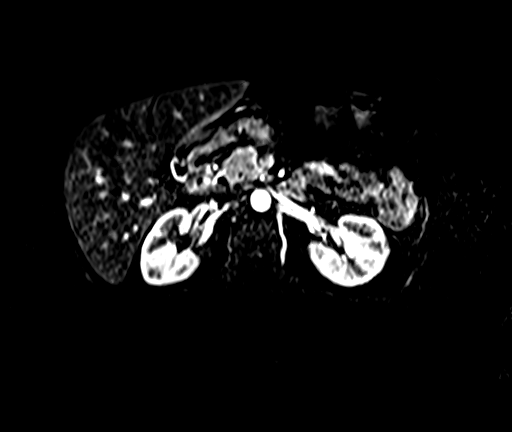
[im 96/96]
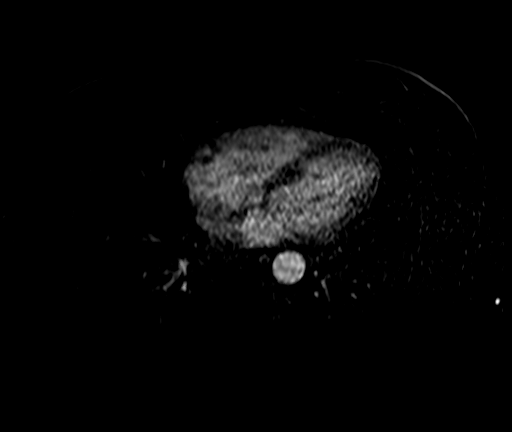

[27 of 48 positions shown; findings below may reference images not displayed]

FINDINGS: Lower chest: Partially visualized postoperative changes in the LEFT
breast following recent mastectomy with fluid in areas of the
mastectomy bed and LEFT axilla. LEFT breast fluid signal 4.6 x
cm on image 30 of series 3 lung bases grossly unremarkable not well
evaluated on MR.

Hepatobiliary: Liver without focal, suspicious hepatic lesion. Small
hepatic cysts seen on previous CT with similar appearance. Focal
area of hypervascularity along the RIGHT hepatic margin fades to
background hepatic signal on delayed phase imaging, no sign of
washout. Post cholecystectomy without biliary duct dilation.

Pancreas: Signs of distal pancreatic atrophy of dorsal pancreas
without ductal dilation and without visible lesion. Normal intrinsic
T1 signal and pancreatic head.

Spleen: Spleen with hypovascular lesions displaying mixed signal on
both T1 and T2 with central low signal on T2, intermediate to low
signal on T1 with loss of signal on in phase as compared to out of
phase T1 weighted gradient echo imaging more compatible with
hemosiderin deposition.

2.2 x 2.0 cm on image 15 of series 14 and 2.7 x 2.4 cm on series 14
image 23. Lesions display a progressive low level enhancement and
lack significant signal on diffusion weighted imaging, much darker
than surrounding spleen. A 6 mm lesion is noted in the inferior
margin of the spleen as well.

Adrenals/Urinary Tract: Bilateral renal cysts without
hydronephrosis. Normal appearance of the adrenal glands.

Stomach/Bowel: Unremarkable appearance of visualized bowel. Pelvic
bowel loops are not imaged.

Vascular/Lymphatic: No adenopathy in the abdomen. Normal caliber
abdominal aorta There is no gastrohepatic or hepatoduodenal ligament
lymphadenopathy. No retroperitoneal or mesenteric lymphadenopathy.

Other:  No ascites

Musculoskeletal: No suspicious bone lesion
IMPRESSION: 1. Splenic lesions with suggestion of hemosiderin, findings could be
seen in the setting of sclerosing angiomatoid nodular transformation
of the spleen, findings are not typical for splenic hemangioma or
hamartoma and also not typical for metastatic disease. Lymphoma is
felt less likely given the appearance and the presence of
hemosiderin and the lack of restricted diffusion. Short interval
follow-up in 8-12 weeks may be helpful to guide further management.
PET-CT could also be considered, potentially helpful differentiating
indolent or benign from frankly malignant process.
2. No suspicious hepatic lesion, small vascular shunt along the
margin of the RIGHT hepatic lobe/transient hepatic intensity
difference and hepatic cysts.
3. Partially visualized postoperative changes in the LEFT breast
following recent mastectomy with fluid in areas of the mastectomy
bed and LEFT axilla. This may represent seroma, correlate with any
signs of inflammation.

## 2020-11-20 IMAGING — MR MR ABDOMEN WO/W CM
18 of 20 series · 46 of 48 positions shown · IV contrast (gadavist)
Comparison: MRI abdomen [DATE] and CT abdomen pelvis
[DATE].

CLINICAL DATA: Follow-up splenic lesions.

EXAM:
MRI ABDOMEN WITHOUT AND WITH CONTRAST
TECHNIQUE: Multiplanar multisequence MR imaging of the abdomen was performed
both before and after the administration of intravenous contrast.
CONTRAST:  8mL GADAVIST GADOBUTROL 1 MMOL/ML IV SOLN

[Series 3: T2 · coronal · 6.0mm · 1.56mm/px · 1 of 36 slices shown (1 of 2)]
[im 1/36]
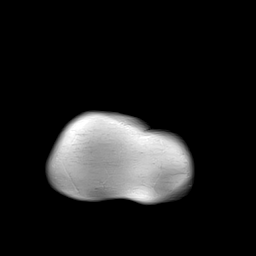

[Series 4: T2 fat-sat · axial · 6.0mm · 1.25mm/px · z∈[-207,+117]mm · 2 of 46 slices shown]
[im 1/46]
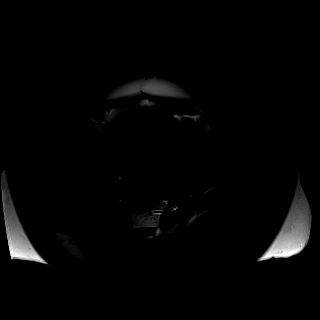
[im 46/46]
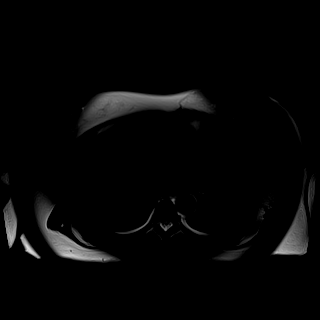

[Series 6: DWI · axial · 6.0mm · 1.49mm/px · z∈[-203,+92]mm · 3 of 84 slices shown (1 of 2)]
[im 1/84]
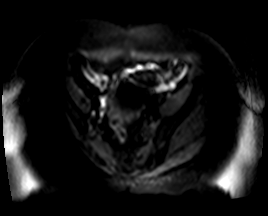
[im 42/84]
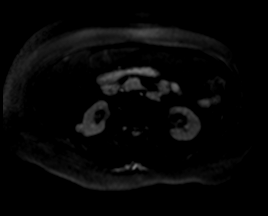
[im 84/84]
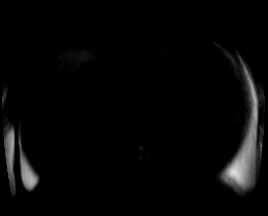

[Series 7: DWI · axial · 6.0mm · 1.49mm/px · 1 of 42 slices shown (2 of 2)]
[im 1/42]
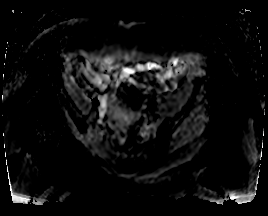

[Series 8: T1 · axial · 3.4mm · 1.25mm/px · z∈[-209,+87]mm · 3 of 88 slices shown (1 of 2)]
[im 1/88]
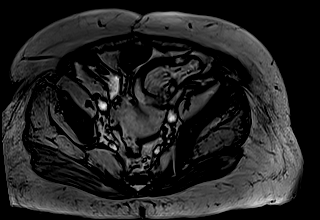
[im 44/88]
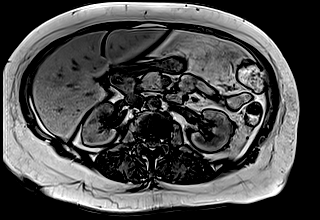
[im 88/88]
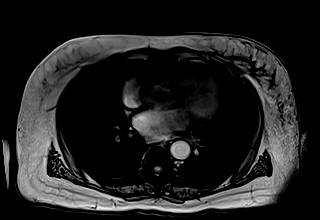

[Series 9: T1 · axial · 3.4mm · 1.25mm/px · z∈[-209,+87]mm · 3 of 88 slices shown (2 of 2)]
[im 1/88]
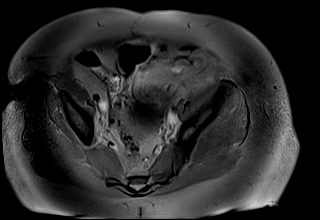
[im 44/88]
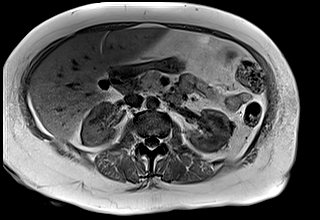
[im 88/88]
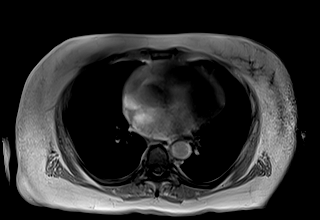

[Series 10: bSSFP · axial · 4.0mm · 0.84mm/px · z∈[-205,+83]mm · 3 of 73 slices shown]
[im 1/73]
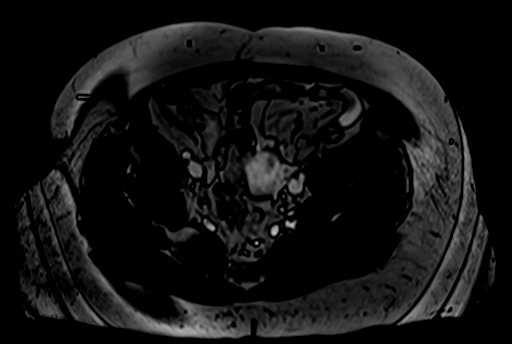
[im 37/73]
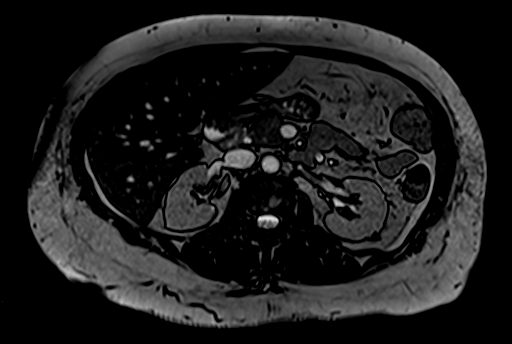
[im 73/73]
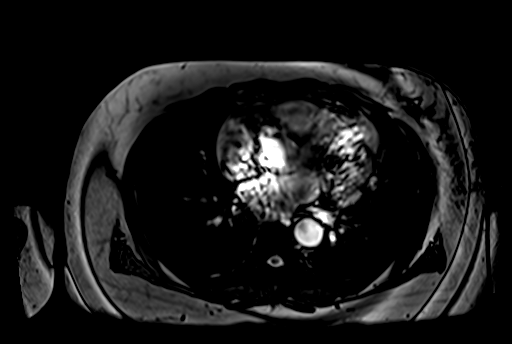

[Series 12: T1 dynamic · axial · 3.2mm · 1.25mm/px · z∈[-200,+79]mm · 3 of 88 slices shown (1 of 6)]
[im 1/88]
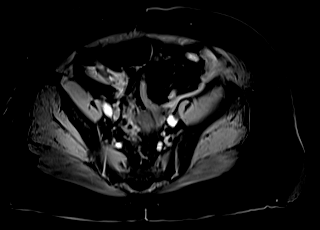
[im 44/88]
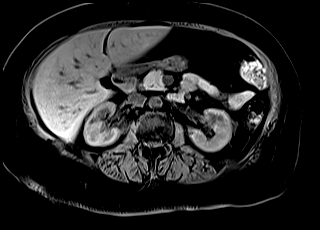
[im 88/88]
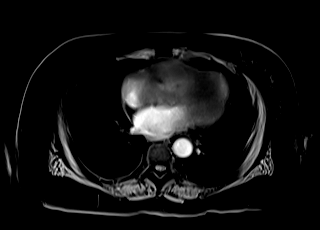

[Series 15: T1 dynamic · axial · 3.2mm · 1.25mm/px · z∈[-200,+79]mm · 3 of 88 slices shown (2 of 6)]
[im 1/88]
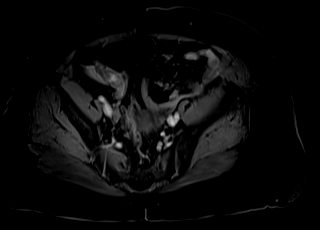
[im 44/88]
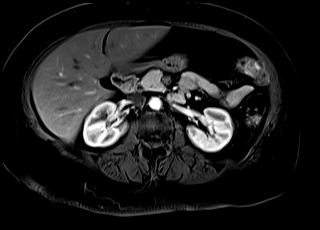
[im 88/88]
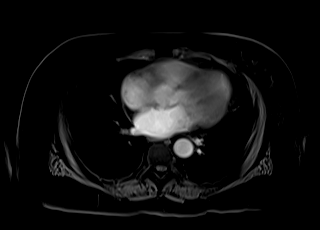

[Series 17: T1 dynamic · axial · 3.2mm · 1.25mm/px · z∈[-200,+79]mm · 3 of 88 slices shown (3 of 6)]
[im 1/88]
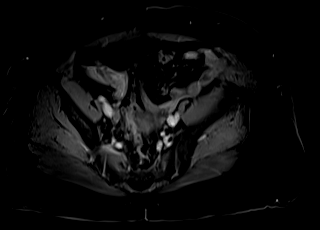
[im 44/88]
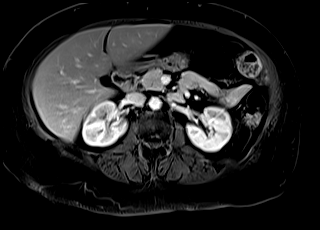
[im 88/88]
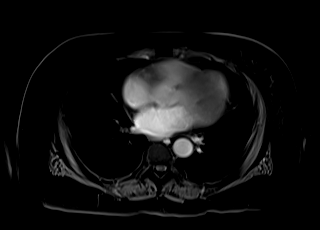

[Series 19: T1 dynamic · axial · 3.2mm · 1.25mm/px · z∈[-200,+79]mm · 3 of 88 slices shown (4 of 6)]
[im 1/88]
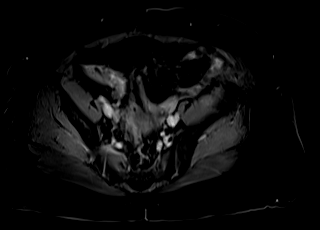
[im 44/88]
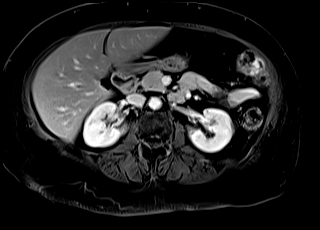
[im 88/88]
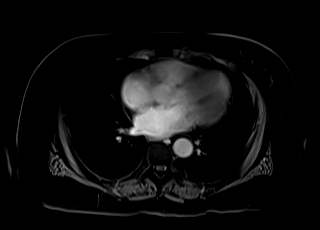

[Series 21: T1 dynamic · coronal · 5.0mm · 1.41mm/px · 2 of 52 slices shown (5 of 6)]
[im 1/52]
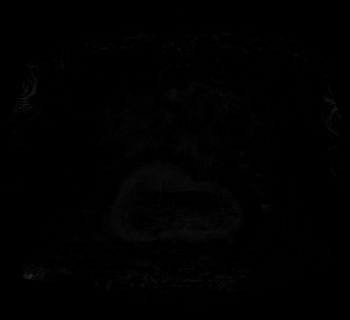
[im 52/52]
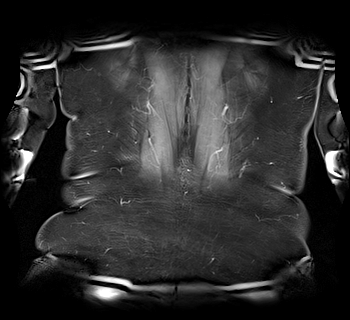

[Series 22: T2 · axial · 6.0mm · 1.56mm/px · 1 of 39 slices shown (2 of 2)]
[im 1/39]
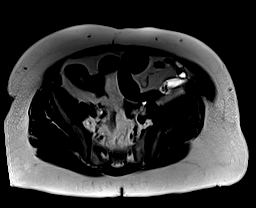

[Series 24: T1 dynamic · axial · 3.2mm · 1.25mm/px · z∈[-200,+79]mm · 3 of 88 slices shown (6 of 6)]
[im 1/88]
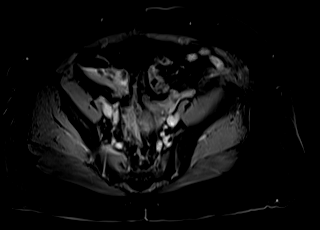
[im 44/88]
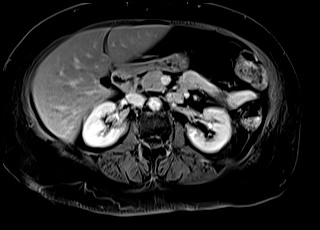
[im 88/88]
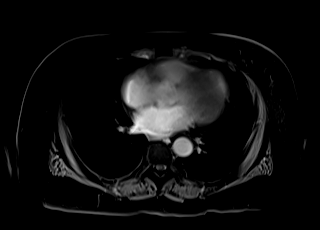

[Series 100: sub_20 sec · axial · 3.2mm · 1.25mm/px · z∈[-200,+79]mm · 3 of 88 slices shown]
[im 1/88]
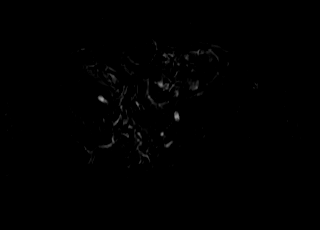
[im 44/88]
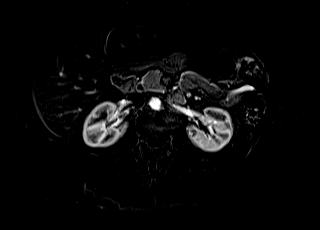
[im 88/88]
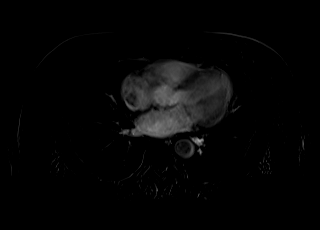

[Series 101: sub_45 sec · axial · 3.2mm · 1.25mm/px · z∈[-200,+79]mm · 3 of 88 slices shown]
[im 1/88]
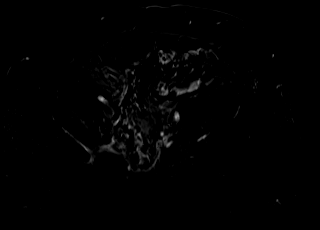
[im 44/88]
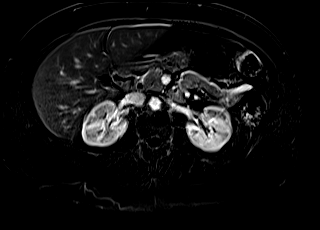
[im 88/88]
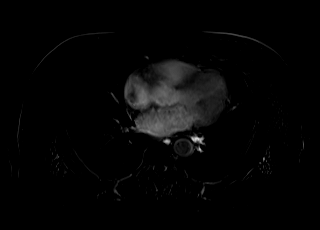

[Series 102: sub_90 sec · axial · 3.2mm · 1.25mm/px · z∈[-200,+79]mm · 3 of 88 slices shown]
[im 1/88]
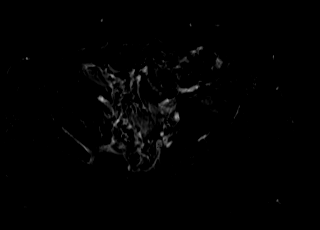
[im 44/88]
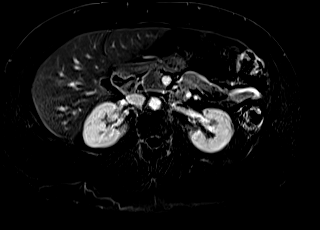
[im 88/88]
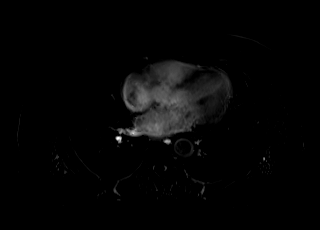

[Series 103: sub 3 min · axial · 3.2mm · 1.25mm/px · z∈[-200,+79]mm · 3 of 88 slices shown]
[im 1/88]
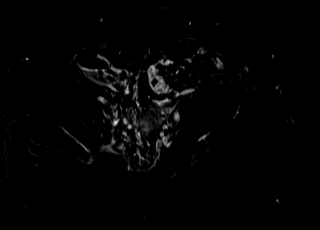
[im 44/88]
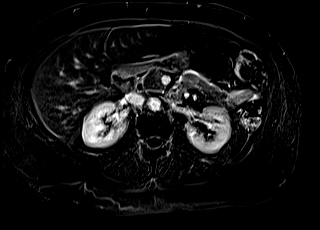
[im 88/88]
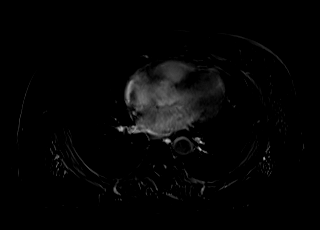

[46 of 48 positions shown; findings below may reference images not displayed]

FINDINGS: Lower chest: Decreased size of the postoperative fluid collection in
the left breast now measuring 4.5 x 3.0 cm previously 4.6 x 3.9 cm
(series 3, image 11). Additionally, the postsurgical fluid seen in
the left axilla has coalesced into a in well-circumscribed fluid
collection measuring 2.7 x 2.0 cm (series 3 image 20) both of these
are favored represent postoperative seromas but are incompletely
evaluated on this study.

Hepatobiliary: Scattered hepatic cysts. No suspicious hepatic
lesion. Again seen is the focal area of hypervascularity along the
right hepatic margin which fades to background hepatic signal on
delayed phase imaging, no signs of washout. Post cholecystectomy
without biliary ductal dilatation.

Pancreas: No mass, inflammatory changes, or other parenchymal
abnormality identified.

Spleen: Similar appearance of the multiple hypovascular splenic
lesions which demonstrate a progressive low level enhancement and
lack reduced diffusivity with heterogeneous signal on both T1 and
T2. Central low signal on T2 and intermediate to low signal on T1,
with loss of signal on inphase compared to out of phase T1 weighted
gradient echo imaging compatible with hemosiderin deposition. Lesion
along the medial aspect of the spleen measures 2.7 x 2.4 cm,
unchanged (series 19 image 25) in the more lateral lesion measures
2.3 x 2.0 cm previously 2.2 x 2.0 (series 19, image 20). Additional
6 mm lesion in the inferior margin of the spleen is unchanged as
well.

Adrenals/Urinary Tract: Normal appearance of bilateral adrenal
glands. Bilateral renal cysts. No suspicious enhancing renal lesion.
No hydronephrosis.

Stomach/Bowel: Visualized portions within the abdomen are
unremarkable.

Vascular/Lymphatic: No pathologically enlarged lymph nodes
identified. No abdominal aortic aneurysm demonstrated.

Other:  No ascites.

Musculoskeletal: No suspicious bone lesions identified.
IMPRESSION: 1. Stable size and appearance of the splenic lesions, with imaging
characteristics most consistent with benign sclerosing angiomatoid
nodular transformation of the spleen. No new or suspicious splenic
lesions.
2. No suspicious hepatic lesion, small vascular shunt along the
margin of the right hepatic lobe/transient hepatic intensity
difference.
3. Evolving postoperative fluid in the left breast and axilla which
likely represent postoperative seromas but are incompletely
evaluated on today study.

## 2020-12-02 IMAGING — CT CT HEAD W/O CM
4 series · 15 of 47 positions shown, 17 images · non-contrast
Comparison: None.

CLINICAL DATA: Tripped and fell hitting the right side of the head
this afternoon. Small laceration. Denies loss of consciousness.

EXAM:
CT HEAD WITHOUT CONTRAST
TECHNIQUE: Contiguous axial images were obtained from the base of the skull
through the vertex without intravenous contrast.

[Series 3: head without · axial · non-contrast · 0.41mm/px · z∈[-83,+37]mm · 7 of 34 slices shown, 9 images]
[im 5/34  brain]
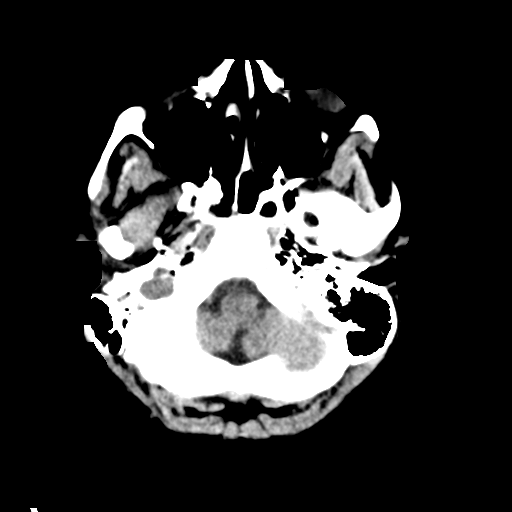
[im 5/34  bone]
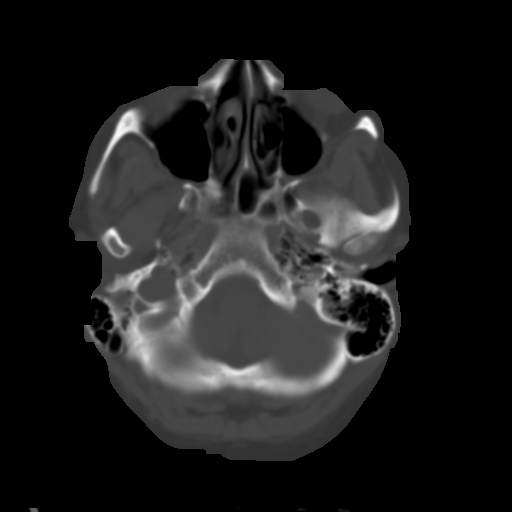
[im 9/34  brain]
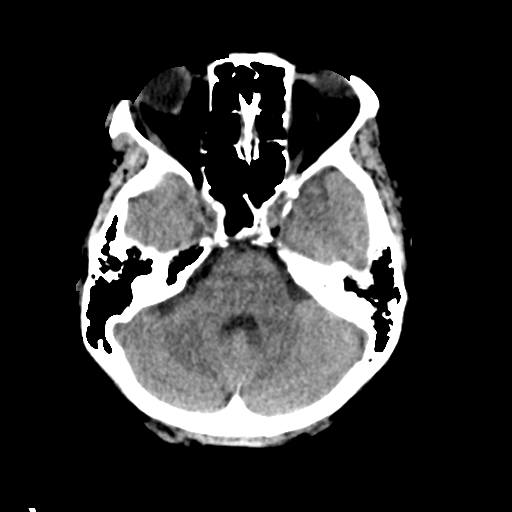
[im 13/34  brain]
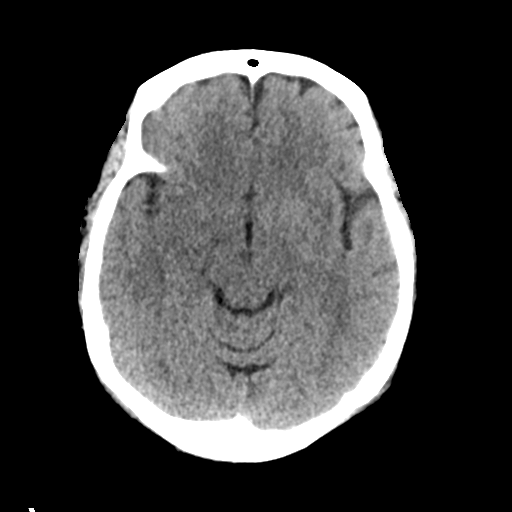
[im 17/34  brain]
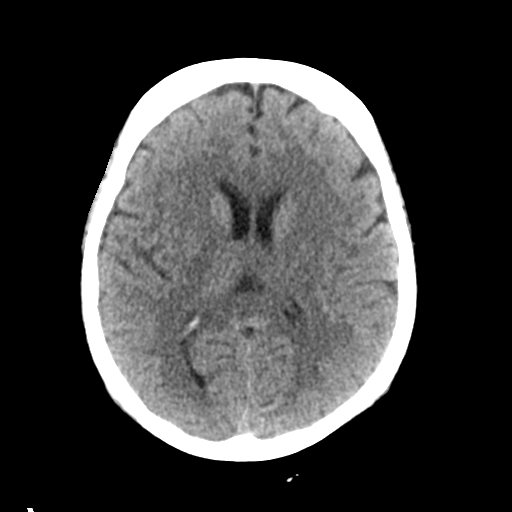
[im 21/34  brain]
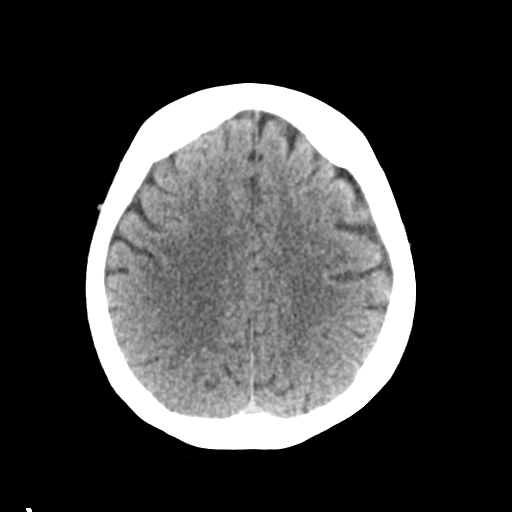
[im 21/34  bone]
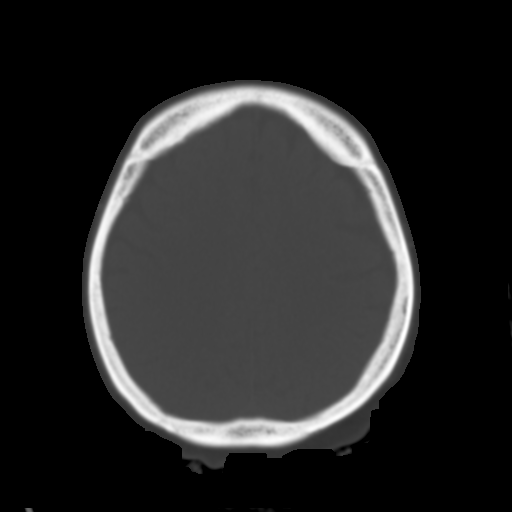
[im 25/34  brain]
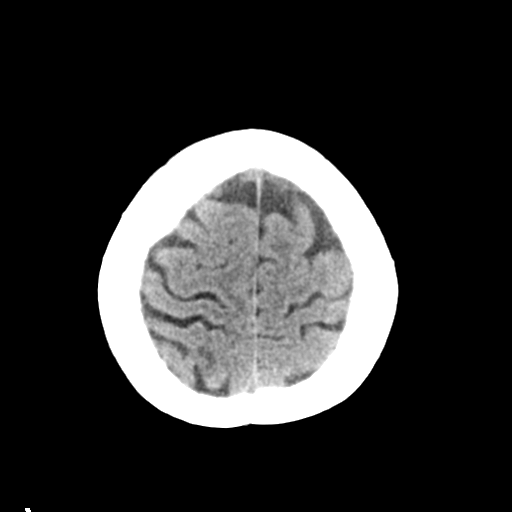
[im 29/34  brain]
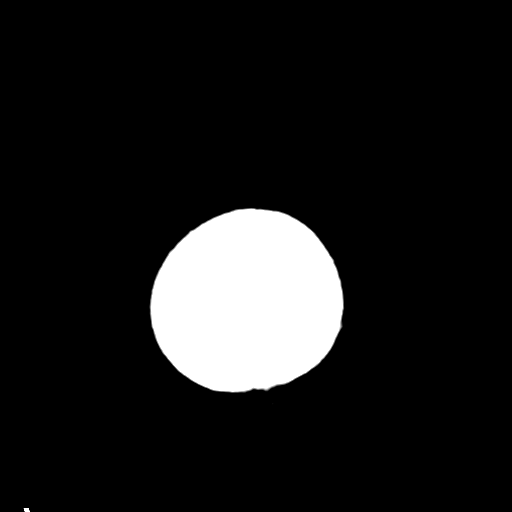

[Series 4: head bone · axial · 0.41mm/px · z∈[-87,-71]mm · 2 of 84 slices shown]
[im 9/84  bone]
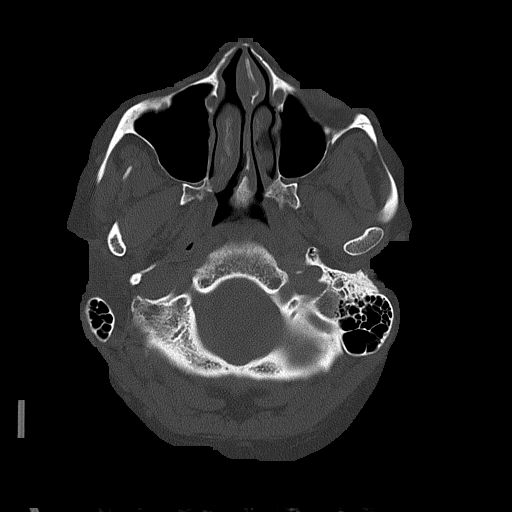
[im 17/84  bone]
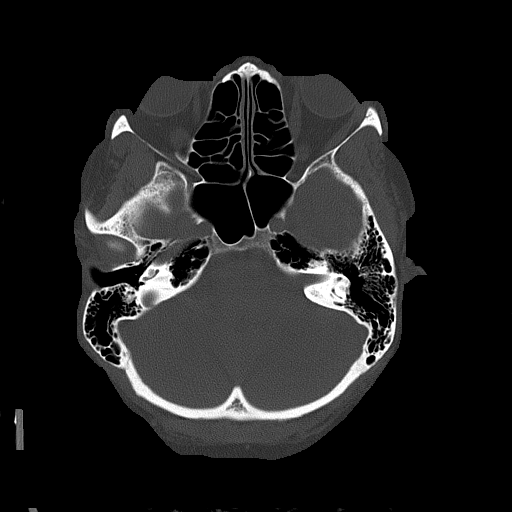

[Series 5: head without cor · coronal · non-contrast · 0.33mm/px · 3 of 67 slices shown]
[im 23/67  brain]
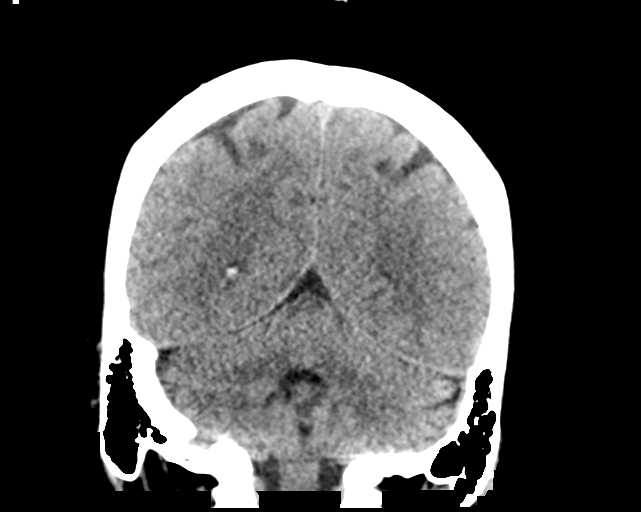
[im 30/67  brain]
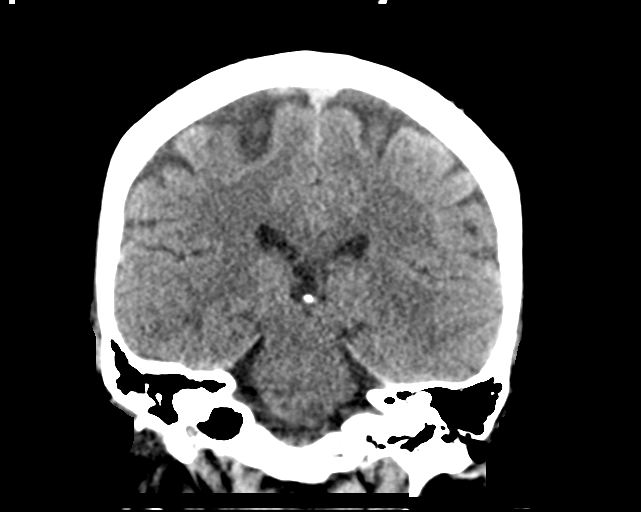
[im 37/67  brain]
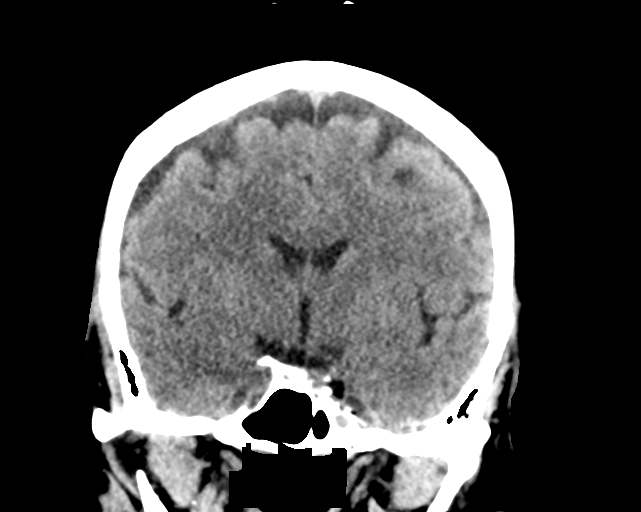

[Series 6: head without sag · sagittal · non-contrast · 0.33mm/px · 3 of 67 slices shown]
[im 23/67  brain]
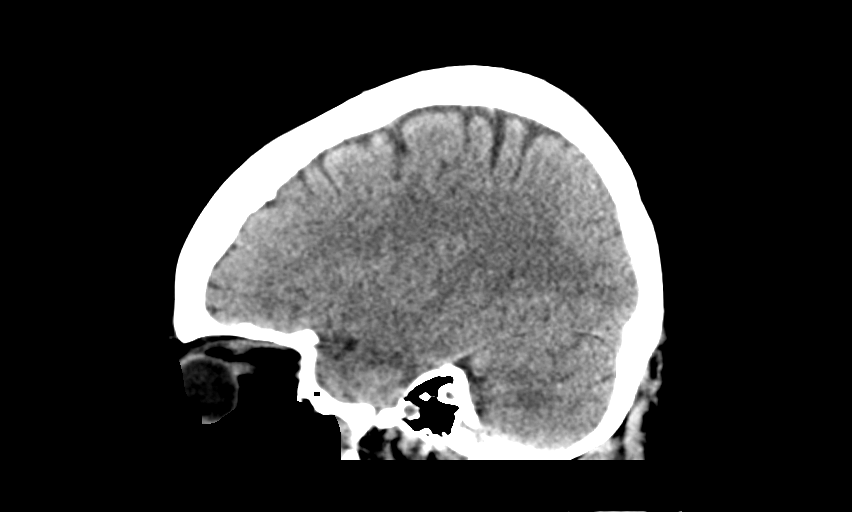
[im 34/67  brain]
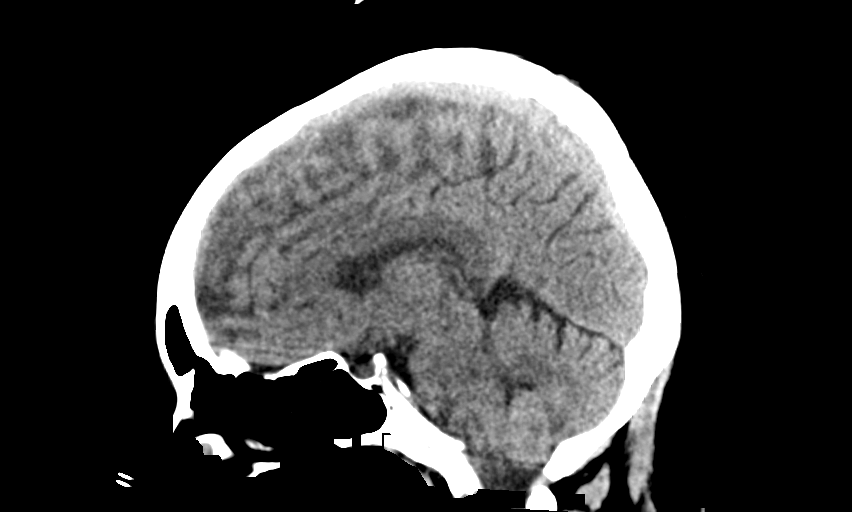
[im 45/67  brain]
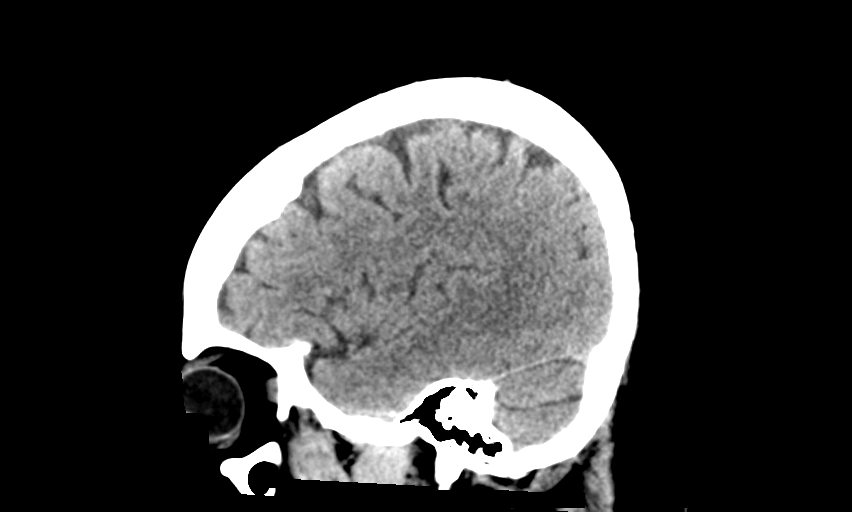

[15 of 47 positions shown; findings below may reference images not displayed]

FINDINGS: Brain: No evidence of acute infarction, hemorrhage, hydrocephalus,
extra-axial collection or mass lesion/mass effect.

Vascular: No hyperdense vessel or unexpected calcification.

Skull: Normal. Negative for fracture or focal lesion.

Sinuses/Orbits: Globes and orbits are unremarkable. Visualized
sinuses and mastoid air cells are clear.

Other: None.
IMPRESSION: Normal unenhanced CT scan of the brain.

## 2021-06-22 IMAGING — MG DIGITAL DIAGNOSTIC BILAT W/ TOMO W/ CAD
6 of 10 series · 6 of 26 positions shown · non-contrast
Comparison: Previous exam(s).

CLINICAL DATA: 60-year-old female status post malignant left
lumpectomy in [DATE].

EXAM:
DIGITAL DIAGNOSTIC BILATERAL MAMMOGRAM WITH TOMOSYNTHESIS AND CAD
TECHNIQUE: Bilateral digital diagnostic mammography and breast tomosynthesis
was performed. The images were evaluated with computer-aided
detection.

[L MLO]
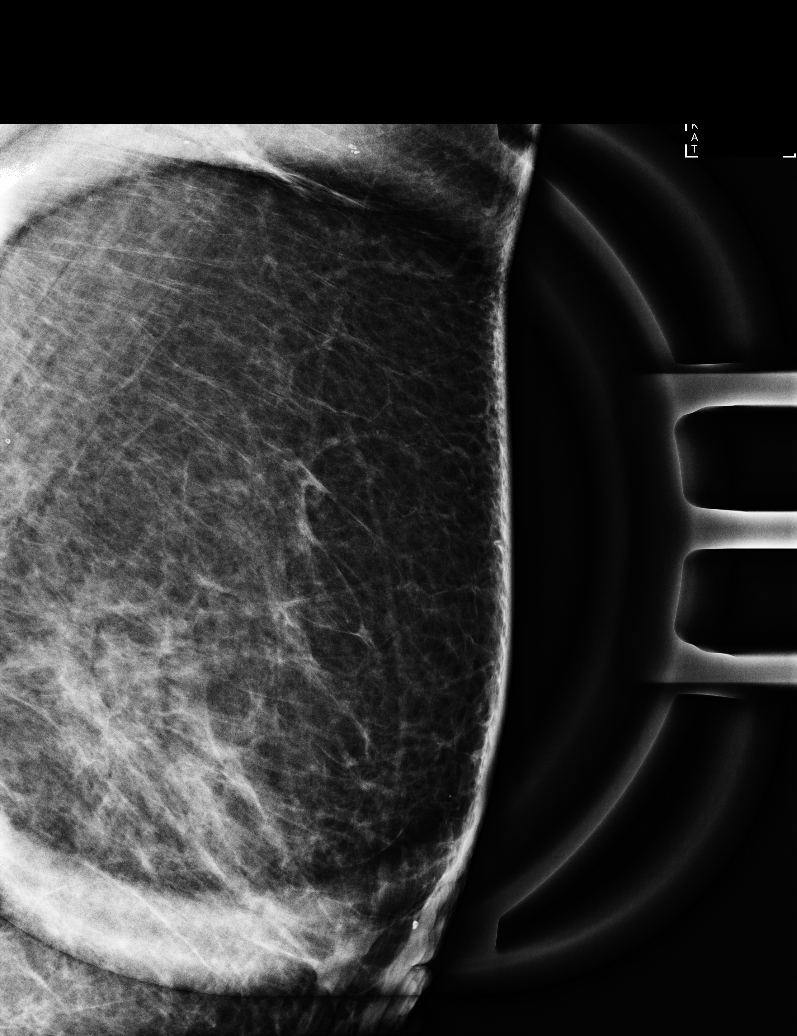

[L CC]
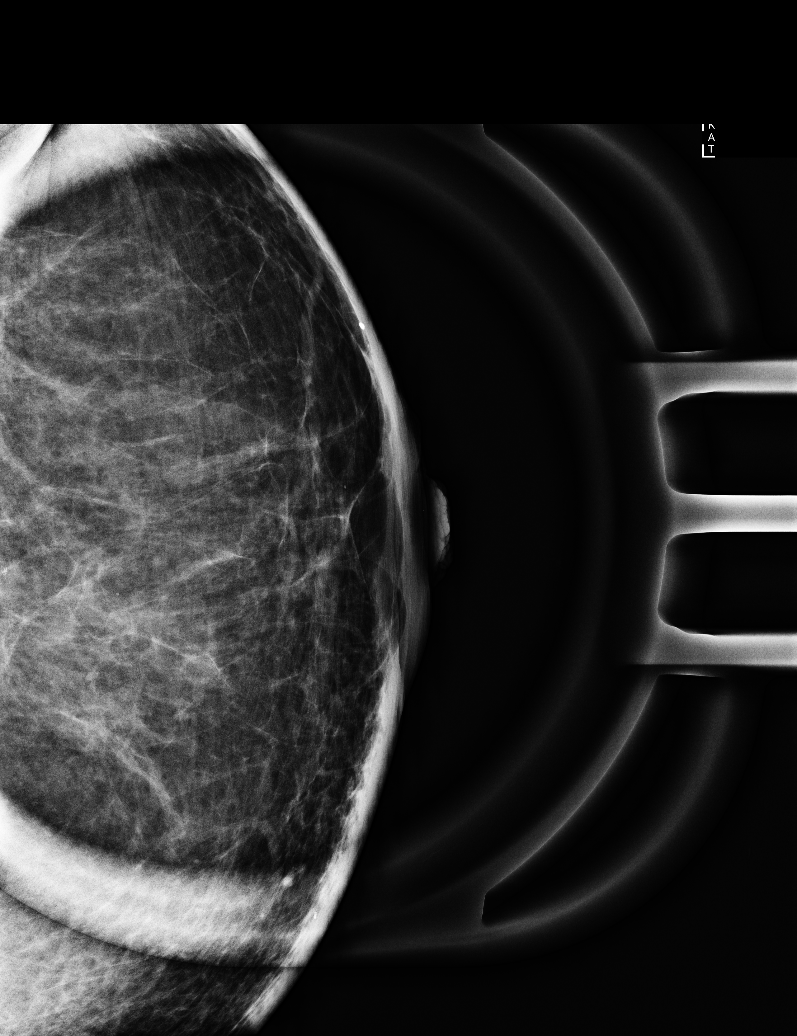

[R MLO synth-2D]
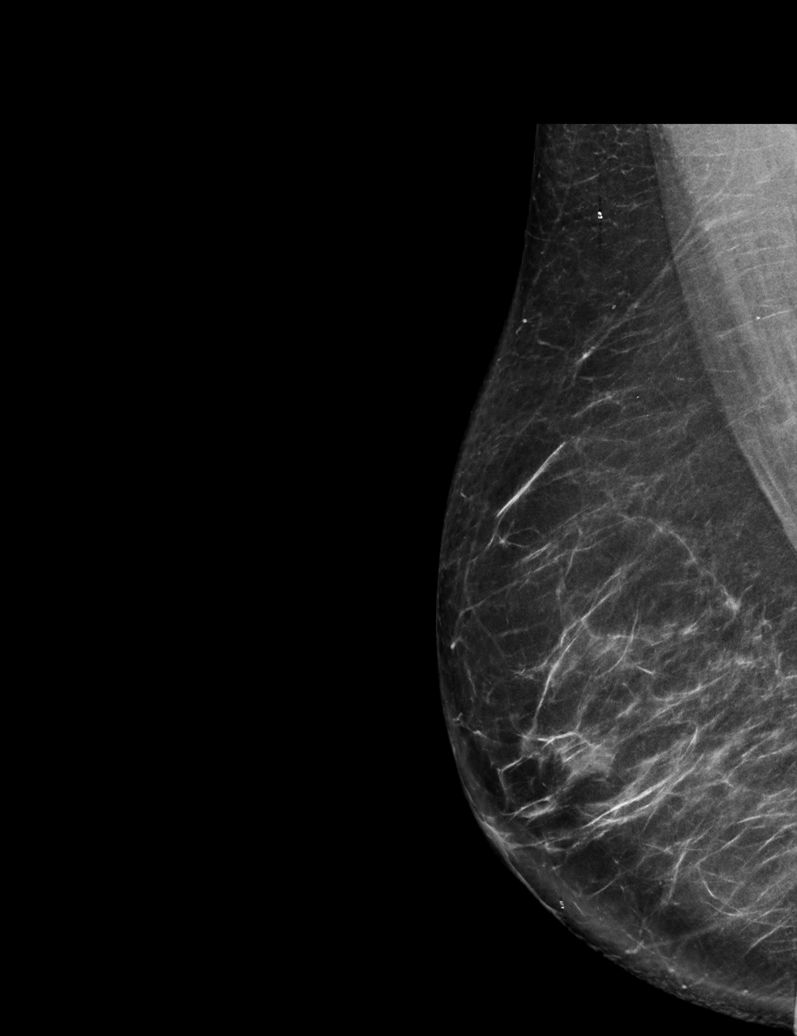

[L MLO synth-2D]
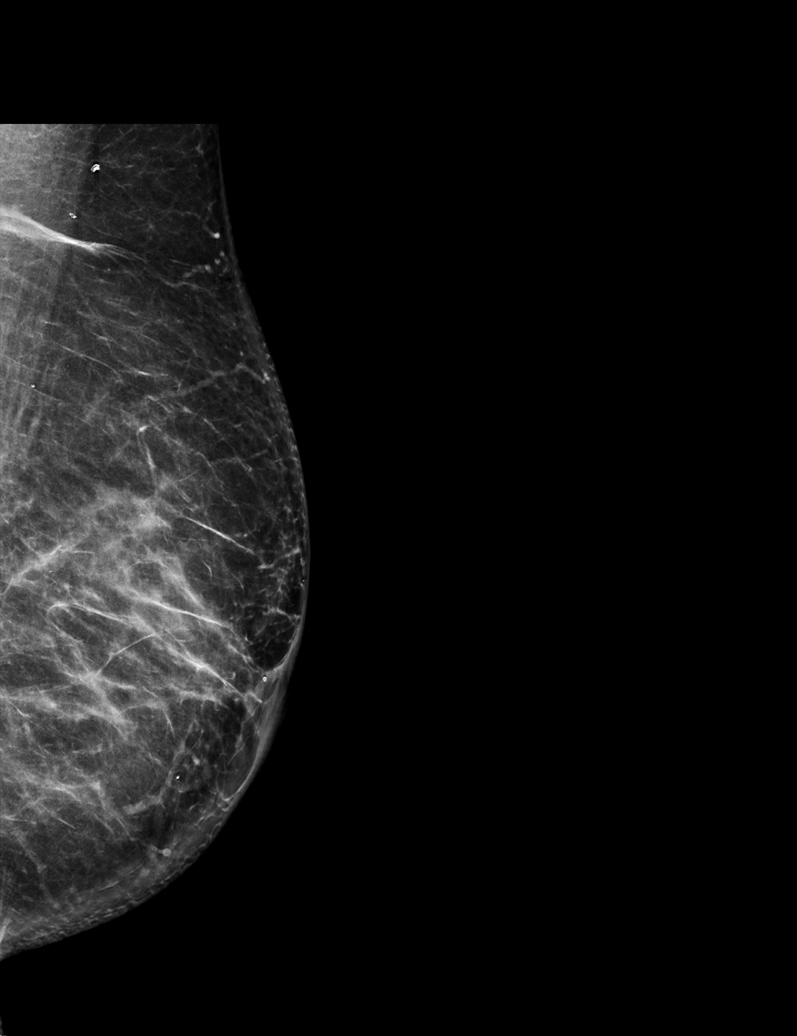

[L CC synth-2D]
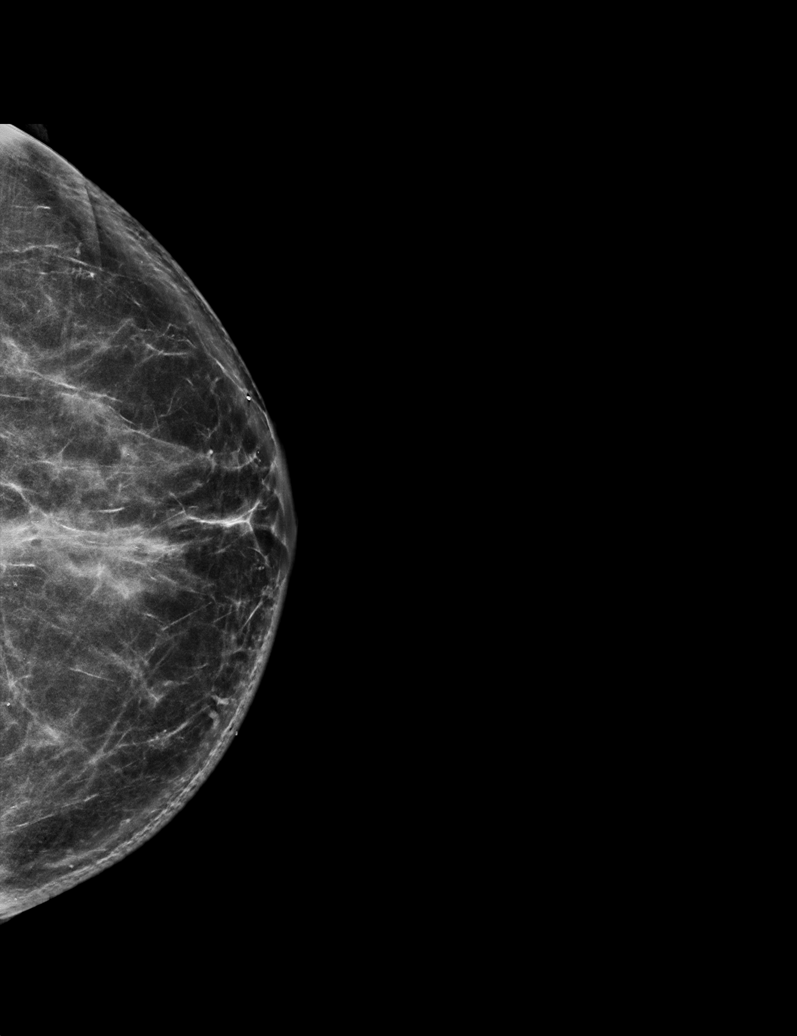

[R CC synth-2D]
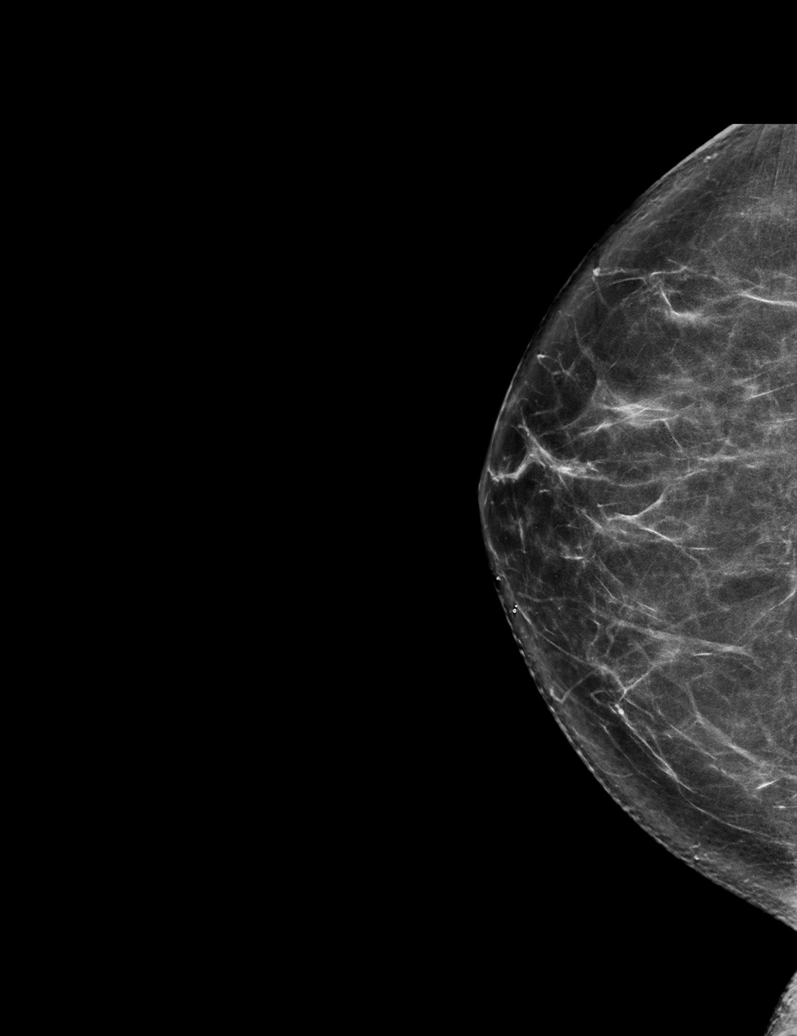

[6 of 26 positions shown; findings below may reference images not displayed]

ACR Breast Density Category b: There are scattered areas of
fibroglandular density.
FINDINGS: There interval post lumpectomy changes in the superior central left
breast at posterior depth. No new or suspicious findings in either
breast. The parenchymal pattern is otherwise stable.
IMPRESSION: 1. No mammographic evidence of malignancy in either breast.
2. Interval left breast posttreatment changes.

RECOMMENDATION:
Diagnostic mammogram is suggested in 1 year. (Code:[9Z])

I have discussed the findings and recommendations with the patient.
If applicable, a reminder letter will be sent to the patient
regarding the next appointment.

BI-RADS CATEGORY  2: Benign.

## 2021-08-20 IMAGING — DX DG KNEE 1-2V PORT*L*
2 series · 2 of 2 positions shown · non-contrast
Comparison: None.

CLINICAL DATA: Postoperative status.

EXAM:
PORTABLE LEFT KNEE - 1-2 VIEW

[knee ap]
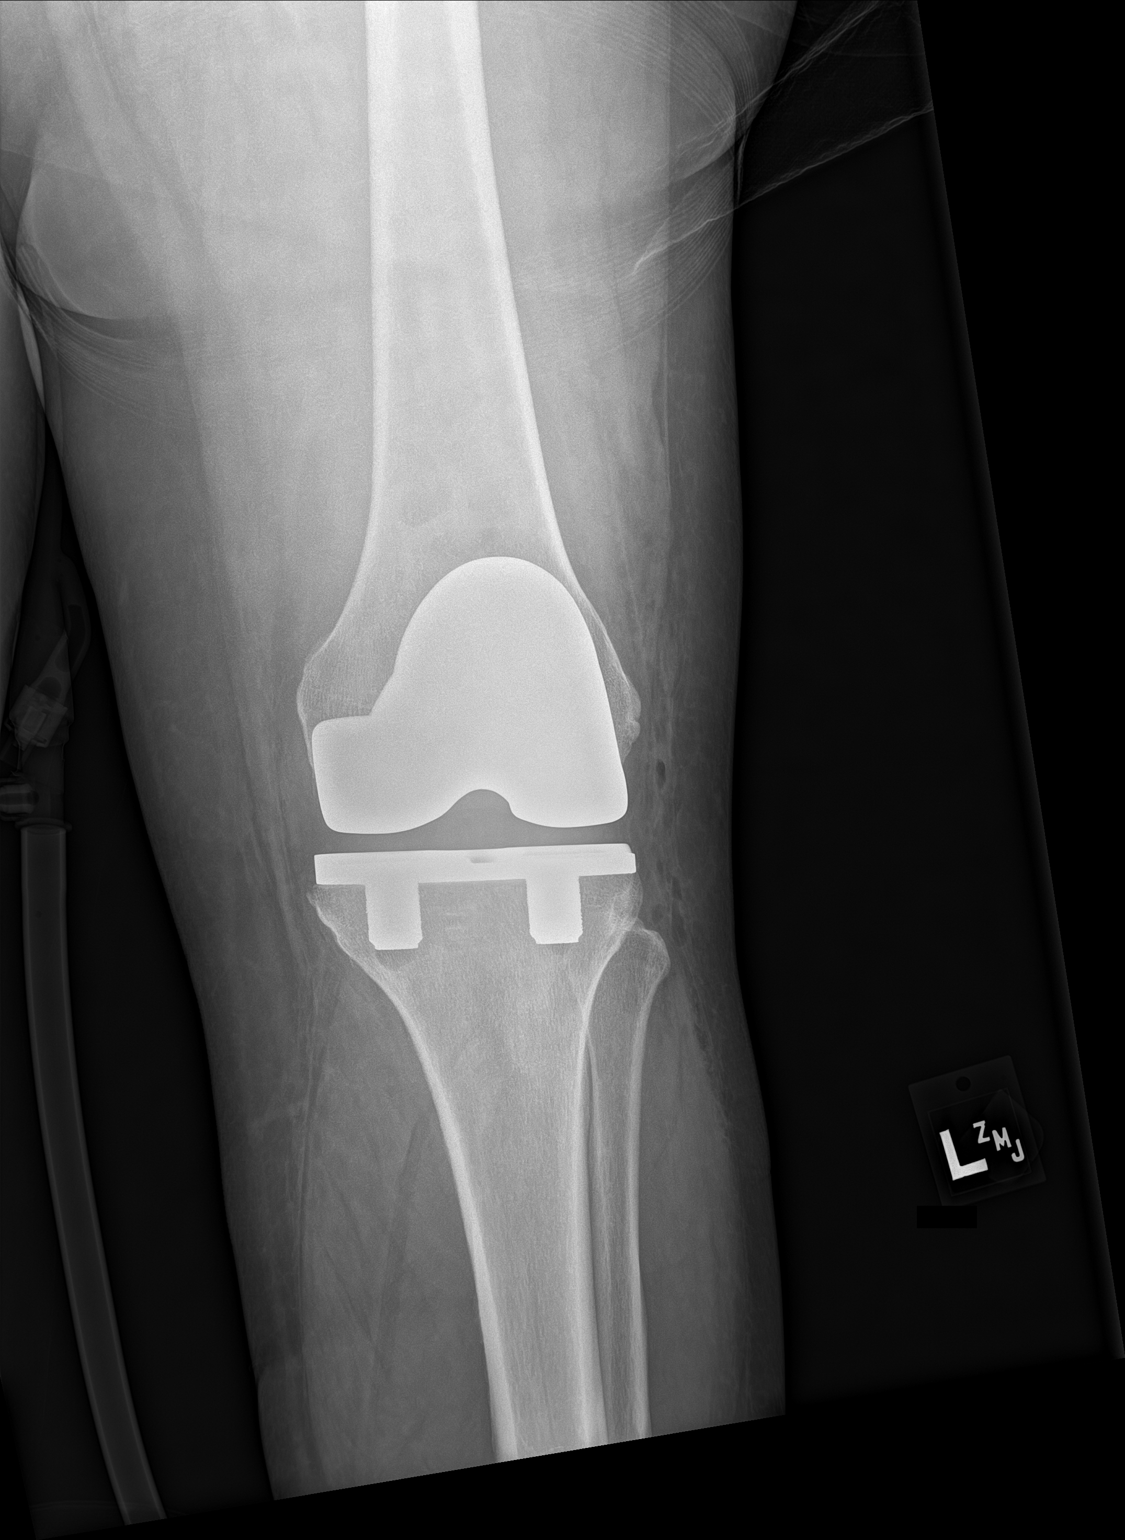

[knee lat]
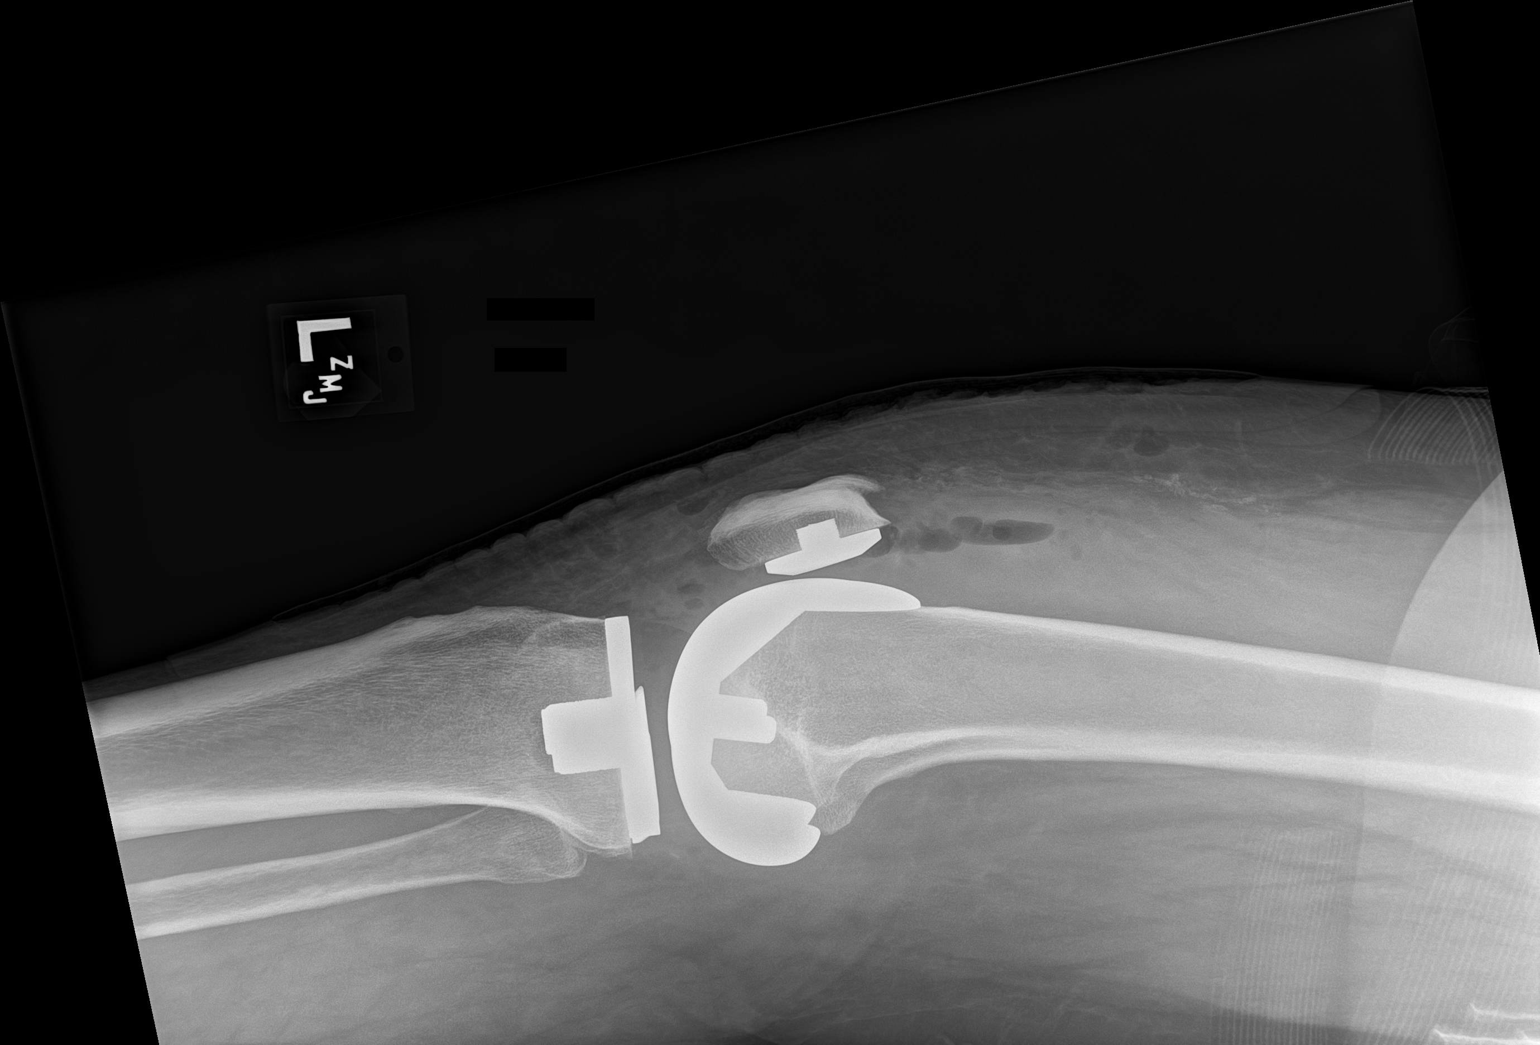

[2 of 2 positions shown; findings below may reference images not displayed]

FINDINGS: Left femoral, tibial and patellar components are well situated.
Expected postoperative changes are seen in the soft tissues
anteriorly.
IMPRESSION: Status post left total knee arthroplasty.
